# Patient Record
Sex: Female | Born: 1990 | Hispanic: Yes | Marital: Married | Smoking: Never smoker
Health system: Southern US, Community
[De-identification: ages and names within clinical notes are randomized; demographics above are authoritative.]

## PROBLEM LIST (undated history)

## (undated) DIAGNOSIS — D649 Anemia, unspecified: Secondary | ICD-10-CM

---
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
## 2010-11-03 ENCOUNTER — Ambulatory Visit: Payer: Self-pay | Admitting: Family Medicine

## 2010-12-05 ENCOUNTER — Ambulatory Visit: Payer: Self-pay | Admitting: Family Medicine

## 2011-05-01 ENCOUNTER — Inpatient Hospital Stay: Payer: Self-pay | Admitting: Obstetrics and Gynecology

## 2011-05-01 LAB — CBC WITH DIFFERENTIAL/PLATELET
Basophil #: 0 10*3/uL (ref 0.0–0.1)
Basophil %: 0 %
Eosinophil #: 0.1 10*3/uL (ref 0.0–0.7)
HCT: 26.9 % — ABNORMAL LOW (ref 35.0–47.0)
Lymphocyte #: 1.4 10*3/uL (ref 1.0–3.6)
Lymphocyte %: 16.9 %
MCHC: 32.3 g/dL (ref 32.0–36.0)
MCV: 71 fL — ABNORMAL LOW (ref 80–100)
Monocyte #: 0.7 10*3/uL (ref 0.0–0.7)
Monocyte %: 7.9 %
Neutrophil #: 6.3 10*3/uL (ref 1.4–6.5)
Platelet: 373 10*3/uL (ref 150–440)
RBC: 3.77 10*6/uL — ABNORMAL LOW (ref 3.80–5.20)
WBC: 8.5 10*3/uL (ref 3.6–11.0)

## 2011-05-03 LAB — HEMOGLOBIN: HGB: 8.1 g/dL — ABNORMAL LOW (ref 12.0–16.0)

## 2011-05-07 ENCOUNTER — Ambulatory Visit: Payer: Self-pay | Admitting: Neonatology

## 2013-06-01 ENCOUNTER — Encounter (HOSPITAL_COMMUNITY): Payer: Self-pay | Admitting: Emergency Medicine

## 2013-06-01 ENCOUNTER — Emergency Department (HOSPITAL_COMMUNITY): Payer: Self-pay

## 2013-06-01 ENCOUNTER — Emergency Department (HOSPITAL_COMMUNITY)
Admission: EM | Admit: 2013-06-01 | Discharge: 2013-06-01 | Disposition: A | Discharge status: Home / Self Care | Payer: Self-pay | Attending: Emergency Medicine | Admitting: Emergency Medicine

## 2013-06-01 DIAGNOSIS — Z79899 Other long term (current) drug therapy: Secondary | ICD-10-CM | POA: Insufficient documentation

## 2013-06-01 DIAGNOSIS — O200 Threatened abortion: Secondary | ICD-10-CM | POA: Insufficient documentation

## 2013-06-01 DIAGNOSIS — Z3490 Encounter for supervision of normal pregnancy, unspecified, unspecified trimester: Secondary | ICD-10-CM

## 2013-06-01 LAB — URINALYSIS, ROUTINE W REFLEX MICROSCOPIC
BILIRUBIN URINE: NEGATIVE
GLUCOSE, UA: NEGATIVE mg/dL
KETONES UR: NEGATIVE mg/dL
Leukocytes, UA: NEGATIVE
Nitrite: NEGATIVE
SPECIFIC GRAVITY, URINE: 1.025 (ref 1.005–1.030)
Urobilinogen, UA: 0.2 mg/dL (ref 0.0–1.0)
pH: 6 (ref 5.0–8.0)

## 2013-06-01 LAB — PREGNANCY, URINE: PREG TEST UR: POSITIVE — AB

## 2013-06-01 LAB — WET PREP, GENITAL
Clue Cells Wet Prep HPF POC: NONE SEEN
Trich, Wet Prep: NONE SEEN
Yeast Wet Prep HPF POC: NONE SEEN

## 2013-06-01 LAB — URINE MICROSCOPIC-ADD ON

## 2013-06-01 LAB — HCG, QUANTITATIVE, PREGNANCY: hCG, Beta Chain, Quant, S: 110 m[IU]/mL — ABNORMAL HIGH (ref <5)

## 2013-06-01 LAB — ABO/RH: ABO/RH(D): O POS

## 2013-06-01 NOTE — ED Provider Notes (Addendum)
CSN: [id number]     Arrival date & time 06/01/13  1531 History   First MD Initiated Contact with Patient 06/01/13 1550     Chief Complaint  Patient presents with  . Vaginal Bleeding     (Consider location/radiation/quality/duration/timing/severity/associated sxs/prior Treatment) Patient is a 23 y.o. female presenting with vaginal bleeding. The history is provided by the patient and the spouse. A language interpreter was used (Spouse).  Vaginal Bleeding  She is pregnant, with last menstrual period, February 2015, no prenatal care yet. She developed vaginal bleeding, and abdominal pain, today. The bleeding is mild. She denies weakness, dizziness, nausea, or vomiting, cough, shortness of breath, or chest pain. There are no other known modifying factors.  History reviewed. No pertinent past medical history. History reviewed. No pertinent past surgical history. History reviewed. No pertinent family history. History  Substance Use Topics  . Smoking status: Never Smoker   . Smokeless tobacco: Not on file  . Alcohol Use: No   OB History   Grav Para Term Preterm Abortions TAB SAB Ect Mult Living                 Review of Systems  Genitourinary: Positive for vaginal bleeding.  All other systems reviewed and are negative.     Allergies  Review of patient's allergies indicates no known allergies.  Home Medications   Prior to Admission medications   Medication Sig Start Date End Date Taking? Authorizing Provider  Prenatal Vit-Fe Fumarate-FA (MULTIVITAMIN-PRENATAL) 27-0.8 MG TABS tablet Take 1 tablet by mouth daily at 12 noon.   Yes Historical Provider, MD   BP 108/67  Pulse 76  Temp(Src) 98.6 F (37 C) (Oral)  Resp 18  Wt 155 lb (70.308 kg)  SpO2 97%  LMP 04/02/2013 Physical Exam  Nursing note and vitals reviewed. Constitutional: She is oriented to person, place, and time. She appears well-developed and well-nourished.  HENT:  Head: Normocephalic and atraumatic.  Eyes:  Conjunctivae and EOM are normal. Pupils are equal, round, and reactive to light.  Neck: Normal range of motion and phonation normal. Neck supple.  Cardiovascular: Normal rate, regular rhythm and intact distal pulses.   Pulmonary/Chest: Effort normal and breath sounds normal. She exhibits no tenderness.  Abdominal: Soft. She exhibits no distension. There is tenderness (left pelvic, mild). There is no guarding.  Genitourinary:  Normal external female genitalia. Vaginal bleeding present, from the cervical os. Mild mucoid discharge from the cervix. The cervix is closed. On bimanual examination the uterus is mildly enlarged, consistent with dates. There is mild left adnexal tenderness, without left adnexal mass. I was unable to palpate either ovary.  Musculoskeletal: Normal range of motion.  Neurological: She is alert and oriented to person, place, and time. She exhibits normal muscle tone.  Skin: Skin is warm and dry.  Psychiatric: She has a normal mood and affect. Her behavior is normal. Judgment and thought content normal.    ED Course  Procedures (including critical care time)  17:08- ultrasound imaging ordered to evaluate for the possibility of ectopic pregnancy. She is hemodynamically stable, at this time.  9:25 PM-Consult complete with Eure. Patient case explained and discussed. He agrees see patient for further evaluation and treatment. Call ended at 21:30  Medications - No data to display  Patient Vitals for the past 24 hrs:  BP Temp Temp src Pulse Resp SpO2 Weight  06/01/13 2135 108/67 mmHg - - 76 18 97 % -  06/01/13 2027 108/64 mmHg 98.6 F (37 C) Oral  76 16 97 % -  06/01/13 1922 113/66 mmHg 98.8 F (37.1 C) Oral 72 16 98 % -  06/01/13 1747 100/54 mmHg - - 71 16 98 % -  06/01/13 1537 125/66 mmHg 97.5 F (36.4 C) Oral 72 14 99 % 155 lb (70.308 kg)    9:34 PM Reevaluation with update and discussion. After initial assessment and treatment, an updated evaluation reveals No further  c/o. Findings discussed with Husband and PAtient, all questions answered. Flint Melter   Labs Review Labs Reviewed  WET PREP, GENITAL - Abnormal; Notable for the following:    WBC, Wet Prep HPF POC FEW (*)    All other components within normal limits  PREGNANCY, URINE - Abnormal; Notable for the following:    Preg Test, Ur POSITIVE (*)    All other components within normal limits  URINALYSIS, ROUTINE W REFLEX MICROSCOPIC - Abnormal; Notable for the following:    APPearance HAZY (*)    Hgb urine dipstick LARGE (*)    Protein, ur TRACE (*)    All other components within normal limits  URINE MICROSCOPIC-ADD ON - Abnormal; Notable for the following:    Squamous Epithelial / LPF FEW (*)    All other components within normal limits  HCG, QUANTITATIVE, PREGNANCY - Abnormal; Notable for the following:    hCG, Beta Chain, Quant, S 110 (*)    All other components within normal limits  GC/CHLAMYDIA PROBE AMP  RPR  HIV ANTIBODY (ROUTINE TESTING)  ABO/RH    Imaging Review US Ob Comp Less 14 Wks  06/01/2013   CLINICAL DATA:  Pregnant, bleeding, left-sided pelvic pain  EXAM: OBSTETRIC <14 WK [place] AND TRANSVAGINAL OB US  TECHNIQUE: Both transabdominal and transvaginal ultrasound examinations were performed for complete evaluation of the gestation as well as the maternal uterus, adnexal regions, and pelvic cul-de-sac. Transvaginal technique was performed to assess early pregnancy.  COMPARISON:  None.  FINDINGS: Intrauterine gestational sac: Not visualized  Maternal uterus/adnexae: Endometrial complex measures 27 mm.  Bilateral ovaries are within normal limits. No adnexal mass is seen.  Small volume pelvic ascites.  IMPRESSION: No IUP is visualized.  By definition, this reflects a pregnancy of unknown location. Primary differential considerations include early normal IUP, abnormal IUP, or nonvisualized ectopic pregnancy. Correlate with beta HCG.  Serial beta HCG is suggested, supplemented by repeat  pelvic sonography in 14 days (or earlier as clinically warranted).   Electronically Signed   By: Charline Bills M.D.   On: 06/01/2013 19:01   US Ob Transvaginal  06/01/2013   CLINICAL DATA:  Pregnant, bleeding, left-sided pelvic pain  EXAM: OBSTETRIC <14 WK [place] AND TRANSVAGINAL OB US  TECHNIQUE: Both transabdominal and transvaginal ultrasound examinations were performed for complete evaluation of the gestation as well as the maternal uterus, adnexal regions, and pelvic cul-de-sac. Transvaginal technique was performed to assess early pregnancy.  COMPARISON:  None.  FINDINGS: Intrauterine gestational sac: Not visualized  Maternal uterus/adnexae: Endometrial complex measures 27 mm.  Bilateral ovaries are within normal limits. No adnexal mass is seen.  Small volume pelvic ascites.  IMPRESSION: No IUP is visualized.  By definition, this reflects a pregnancy of unknown location. Primary differential considerations include early normal IUP, abnormal IUP, or nonvisualized ectopic pregnancy. Correlate with beta HCG.  Serial beta HCG is suggested, supplemented by repeat pelvic sonography in 14 days (or earlier as clinically warranted).   Electronically Signed   By: Charline Bills M.D.   On: 06/01/2013 19:01  EKG Interpretation None      MDM   Final diagnoses:  Pregnancy  Threatened abortion    Early pregnancy with vaginal bleeding. Rh+. BHCG much less than expected for date of pregnancy. Inevitable abortion expected, possibly already terminated.  Nursing Notes Reviewed/ Care Coordinated Applicable Imaging Reviewed Interpretation of Laboratory Data incorporated into ED treatment  The patient appears reasonably screened and/or stabilized for discharge and I doubt any other medical condition or other [facility] requiring further screening, evaluation, or treatment in the ED at this time prior to discharge.  Plan: Home Medications- none; Home Treatments- rest; return here if the recommended treatment,  does not improve the symptoms; Recommended follow up- GYN 72 hrs for eval. And repeat Quant. HCG and prn  2150- I gave the patient  verbal discharge instructions and extensive explanation for the findings, today, using the language line, translator.   Flint MelterElliott L Elfida Shimada, MD 06/01/13 2138  Flint MelterElliott L Dossie Swor, MD 06/01/13 2152

## 2013-06-01 NOTE — ED Notes (Addendum)
In with EDP to perform pelvic exam. Pt tolerated pelvic exam well. Radiology has called for [place]

## 2013-06-01 NOTE — ED Notes (Signed)
Pt husband reports pt took a home pregnancy test on Wednesday that read positive. Pt husband reports that pt started having small amount of vaginal bleeding this am. Pt husband reports pt has had two pregnancies in the past with no complication. Pt reports LLQ abdominal pain but denies n/v.

## 2013-06-01 NOTE — ED Notes (Signed)
EDP aware pt ready for pelvic exam.

## 2013-06-01 NOTE — Discharge Instructions (Signed)
The pregnancy may be going through termination. It is important to rest, drink a lot of fluids, and avoid intercourse. You will need to followup with the gynecologist for a checkup in 3 days. You can take Tylenol for pain. Return here, if needed, for problems.     Psychiatristmbarazo (Pregnancy) Si planea quedar embarazada, es una buena idea concertar una cita de preconcepcin con el mdico para poder lograr un estilo de vida saludable ante de quedar embarazada. Esto incluye dieta, peso, ejercicio, el tomar vitaminas prenatales en especial cido flico (ayuda a prevenir defectos en el cerebro y la mdula espinal), evitar el alcohol, fumar, las drogas ilegales, problemas mdicos (diabetes, convulsiones), historial familiar de problemas genticos, condiciones de trabajo e inmunizaciones. Es mejor tener conocimiento de estas cosas y Radio producerhacer algo antes de quedar embarazada. Si est embarazada, es necesario que siga ciertas pautas para tener un beb sano. Es muy importante Education officer, environmentalrealizar controles prenatales adecuados y seguir las indicaciones del profesional que la asiste. La atencin prenatal incluye toda la asistencia mdica que usted recibe antes del nacimiento del beb. Esto ayuda a Publishing copyprevenir problemas durante el embarazo y Kamrarel parto. INSTRUCCIONES PARA EL CUIDADO DOMICILIARIO  Comience las consultas prenatales alrededor de la 12 semana de embarazo o lo antes posible. Al principio generalmente se programan cada mes. Se hacen ms frecuentes en los 2 ltimos meses antes del parto. Es importante que concurra a todas las citas con el profesional y siga sus instrucciones con Camera operatorrespecto a los medicamentos que deba Chemical engineerutilizar, a la actividad fsica y a Psychologist, forensicla dieta.  Durante el embarazo debe obtener nutrientes para usted y para su beb. Consuma una dieta normal y bien balanceada. Elija alimentos como carne, pescado, [place]leche y otros productos lcteos, vegetales, frutas, panes integrales y Research officer, trade unioncereales El profesional le informar cul es el  aumento de Roscoepeso ideal, segn su peso y Risk analystaltura actuales. Beba gran cantidad de lquidos. Trate de beber 8 vasos de lquidos por Futures traderda.  El alcohol se asocia a cierto nmero de defectos del nacimiento, incluyendo el sndrome de alcoholismo fetal. Lo mejor es evitarlo completamente El cigarrillo causa nacimientos prematuros y bebs de bajo peso al Psychologist, clinicalnacer. El consumo de alcohol y nicotina durante el embarazo tambin aumentan marcadamente la probabilidad de que el nio sea qumicamente dependiente en etapas posteriores de su vida y puede contribuir al sndrome de muerte sbita infantil (SMSI)  No consuma drogas.  Solo tome medicamentos prescriptos o de venta libre que le haya recomendado el profesional. Algunos medicamentos pueden causar problemas genticos y fsicos al beb  Las nuseas matinales pueden aliviarse si come Chiropodistalgunas galletitas saladas en la cama. Coma dos galletitas antes de levantarse por la maana.  Las relaciones sexuales pueden continuarse hasta casi el final del embarazo, si no se presentan otros problemas como prdida prematura (antes de tiempo) de lquido amnitico, Restaurant manager, fast foodhemorragia vaginal, dolor durante las relaciones sexuales o dolor abdominal (en el vientre).  Practique ejercicios con regularidad. Consulte con el profesional que la asiste si no sabe con certeza si determinados ejercicios son seguros.  No utilice la baera con agua caliente, baos turcos y saunas. Estos aumentan el riesgo de sufrir un desmayo o de prdida del conocimiento, y as Runner, broadcasting/film/videolastimarse usted o el beb. La natacin es un buen ejercicio. Descanse todo lo que pueda e incluya una siesta despus de almorzar siempre que le sea posible, especialmente durante el tercer trimestre.  Evite los olores y las sustancias qumicas txicas.  No use zapatos de tacones altos, podra perder el  equilibrio y Retail bankercaer.  No levante objetos de ms de 2,5 kg. Si levanta un objeto, flexione las piernas y los muslos, y no la espalda.  Evite los  viajes largos, Paramedicespecialmente en el tercer trimestre.  Si debe viajar fuera de la ciudad o de su estado, lleve una copia de la historia clnica. SOLICITE ATENCIN MDICA DE INMEDIATO SI:  La temperatura oral se eleva sin motivo por encima de 38,9 C (102 F) o segn le indique el profesional que lo asiste.  Tiene una prdida de lquido por la vagina. Si sospecha una ruptura de las Milbridgemembranas, tmese la temperatura y llame al profesional para informarlo sobre esto.  Observa unas pequeas manchas o una hemorragia vaginal Notifique al profesional acerca de la cantidad y de cuntos apsitos est utilizando.  Contina teniendo nuseas y no obtiene alivio de los Cardinal Healthmedicamentos que le han Newaldindicado, o vomita sangre o una sustancia similar a la borra del caf.  Presenta un dolor en la zona superior del abdomen.  Siente molestias en el ligamento redondo en la parte abdominal baja. El profesional que la asiste Hydrologistla evaluar.  Siente pequeas contracciones del tero (matriz)  No siente que el beb se mueve, o percibe menos movimientos que antes.  Siente dolor al ConocoPhillipsorinar.  Brett Fairybserva una hemorragia vaginal anormal.  Tiene diarrea persistente.  Sufre una cefalea grave.  Tiene problemas visuales.  Comienza a sentir debilidad muscular.  Se siente mareada o sufre un desmayo.  Comienza a sentir falta de aire.  Siente dolor en el pecho.  Sufre dolor en la espalda que se irradia hacia la pierna y el pie.  Siente latidos cardacos irregulares o la frecuencia cardaca es muy rpida.  Aumenta excesivamente de peso en un perodo breve (2,5 kg en 3 a 5 das)  Se ve envuelta en una situacin de violencia domstica. Document Released: 11/02/2004 Document Revised: 04/17/2011 [facility] Patient Information 2014 [employer], [place].

## 2013-06-02 LAB — HIV ANTIBODY (ROUTINE TESTING W REFLEX): HIV: NONREACTIVE

## 2013-06-02 LAB — RPR

## 2013-06-03 LAB — GC/CHLAMYDIA PROBE AMP
CT Probe RNA: NEGATIVE
GC PROBE AMP APTIMA: NEGATIVE

## 2014-04-17 LAB — OB RESULTS CONSOLE GBS: GBS: NEGATIVE

## 2014-05-31 NOTE — Op Note (Signed)
PATIENT NAME:  Aimee Sherman, Aimee Sherman MR#:  [id number] DATE OF BIRTH:  07-17-1990  DATE OF PROCEDURE:  05/02/2011  ADDENDUM  This is a clarification on Aimee Sherman. I  wanted to make sure that I had the orientation correct--  the left arm was posterior, right arm was anterior, and on immediate assessment in the delivery room the neonatologist felt that most likely the left clavicle was fractured. Too soon to make accurate assessment on neural function of the right upper extremity. Again, the left shoulder was posterior, right was anterior, probable fractured left clavicle.   ____________________________ Reatha Harpsicky L. Logan BoresEvans, MD rle:bjt D: 05/02/2011 03:44:46 ET T: 05/02/2011 11:15:13 ET JOB#: [id number]  cc: Aimee L. Logan BoresEvans, MD, <Dictator> Aimee MoodICK L Elsie Baynes MD ELECTRONICALLY SIGNED 05/03/2011 11:25

## 2014-05-31 NOTE — Op Note (Signed)
PATIENT NAME:  Aimee Sherman, Aimee Sherman MR#:  [id number] DATE OF BIRTH:  [birth year]  DATE OF PROCEDURE:  05/02/2011  PREOPERATIVE DIAGNOSES:  1. Severe shoulder dystocia. 2. [redacted] week gestation.   POSTOPERATIVE DIAGNOSES: 1. Severe shoulder dystocia. 2. [redacted] week gestation.   PROCEDURE: Management of shoulder dystocia with delivery of infant.  OPERATOR: Ricky L. Logan BoresEvans, MD    ESTIMATED BLOOD LOSS: 500.  COMPLICATION: Probable fracture of the left clavicle.   DRAINS: None.   PROCEDURE IN DETAIL: I was called to delivery room from my call room for delivery. Mom was a 89 year old G2 P1 at 41 weeks and 1 day. The patient appeared to be pushing well and there was evidence of a "turtle effect" and asked nurse to be ready for potential dystocia. Bed was flattened, lowered, and foot stool had been brought over previously by the nurse as she  anticipated the same.    Head of infant was delivered with immediate identification of shoulder dystocia, right shoulder anterior.   With nurse providing suprapubic pressure, attempted wood screw maneuver which was unsuccessful. Gentle downward traction was attempted but care was taken not to overextend the neck.   After approximately 2-1/2 minutes of attempted repositioning of the shoulders, I was able to deliver the left arm in an anterior sweeping motion from caudad to cephalad which allowed the anterior shoulder to drop. Rest of the infant was delivered, cord was clamped x2 and cut. Infant was noted to be floppy initially and Neonatology immediately assessed.   Mom handled the procedure well. There was no evidence of lacerations and IM Pitocin along with p.o. Cytotec was given.   Cord gas is pending as are Apgars. Infant was breathing on its own and was taken to Special Care Nursery for observation.   ____________________________ Reatha Harpsicky L. Logan BoresEvans, MD rle:drc D: 05/02/2011 03:42:32 ET T: 05/02/2011 11:07:14 ET JOB#: [id number]  cc: Clide Clifficky L. Logan BoresEvans, MD,  <Dictator> Augustina MoodICK L Shernell Saldierna MD ELECTRONICALLY SIGNED 05/03/2011 11:25

## 2014-10-21 LAB — OB RESULTS CONSOLE ABO/RH: RH Type: POSITIVE

## 2014-10-21 LAB — OB RESULTS CONSOLE HGB/HCT, BLOOD
HCT: 33 %
Hemoglobin: 11.2 g/dL

## 2014-10-21 LAB — OB RESULTS CONSOLE HEPATITIS B SURFACE ANTIGEN: HEP B S AG: NEGATIVE

## 2014-10-21 LAB — OB RESULTS CONSOLE VARICELLA ZOSTER ANTIBODY, IGG: VARICELLA IGG: IMMUNE

## 2014-10-21 LAB — OB RESULTS CONSOLE ANTIBODY SCREEN: ANTIBODY SCREEN: NEGATIVE

## 2014-10-21 LAB — OB RESULTS CONSOLE RPR: RPR: NONREACTIVE

## 2014-10-21 LAB — OB RESULTS CONSOLE RUBELLA ANTIBODY, IGM: RUBELLA: IMMUNE

## 2014-10-21 LAB — OB RESULTS CONSOLE GC/CHLAMYDIA
Chlamydia: NEGATIVE
Gonorrhea: NEGATIVE

## 2014-10-21 LAB — OB RESULTS CONSOLE HIV ANTIBODY (ROUTINE TESTING): HIV: NONREACTIVE

## 2014-11-04 ENCOUNTER — Other Ambulatory Visit: Payer: Self-pay | Admitting: Physician Assistant

## 2014-11-04 LAB — HEMOGLOBIN: Hemoglobin: 10.5 g/dL — ABNORMAL LOW (ref 12.0–15.0)

## 2014-11-27 ENCOUNTER — Other Ambulatory Visit: Payer: Self-pay | Admitting: Family Medicine

## 2014-11-27 DIAGNOSIS — Z331 Pregnant state, incidental: Secondary | ICD-10-CM

## 2014-12-16 ENCOUNTER — Ambulatory Visit
Admission: RE | Admit: 2014-12-16 | Discharge: 2014-12-16 | Disposition: A | Discharge status: Home / Self Care | Payer: Self-pay | Source: Ambulatory Visit | Attending: Family Medicine | Admitting: Family Medicine

## 2014-12-16 ENCOUNTER — Ambulatory Visit: Payer: Self-pay

## 2014-12-16 DIAGNOSIS — Z36 Encounter for antenatal screening of mother: Secondary | ICD-10-CM | POA: Insufficient documentation

## 2014-12-16 DIAGNOSIS — Z3A19 19 weeks gestation of pregnancy: Secondary | ICD-10-CM | POA: Insufficient documentation

## 2014-12-16 DIAGNOSIS — Z331 Pregnant state, incidental: Secondary | ICD-10-CM

## 2014-12-29 ENCOUNTER — Ambulatory Visit: Payer: Self-pay | Admitting: Physician Assistant

## 2015-02-07 NOTE — L&D Delivery Note (Signed)
Delivery Note At 3:25 AM a viable female was delivered via Vaginal, Spontaneous Delivery (Presentation: LOA;  ).spontaneous cry on perineum. Delayed cord clamping  APGAR:8/9 , ; weight  .   Placenta status: nl + meconium staining  , .  Cord:  with the following complications: Thick meconium .    Anesthesia: Epidural  Episiotomy:  none Lacerations: 1st degree Suture Repair: 3.0 vicryl Est. Blood Loss (mL):  400 cc  Mom to postpartum.  Baby to Couplet care / Skin to Skin.  Aimee Sherman 05/07/2015, 3:44 AM

## 2015-04-29 NOTE — H&P (Signed)
Aimee Sherman is a 25 y.o. female [phone] presenting for induction of labor at 41+2wks. However, she came in and delivered spontaneously prior to her induction date, so this H&P is no longer relevant. Please ignore. THe electronic medical record system does not let me delete this planned H&P.   History OB History    No data available     No past medical history on file. No past surgical history on file. Family History: family history is not on file. Social History:  reports that she has never smoked. She does not have any smokeless tobacco history on file. She reports that she does not drink alcohol or use illicit drugs.   ROS    There were no vitals taken for this visit. Exam Physical Exam    Aimee Sherman 04/29/2015, 12:32 PM

## 2015-05-05 ENCOUNTER — Emergency Department (HOSPITAL_COMMUNITY)
Admission: EM | Admit: 2015-05-05 | Discharge: 2015-05-05 | Disposition: A | Discharge status: Home / Self Care | Payer: Self-pay | Attending: Emergency Medicine | Admitting: Emergency Medicine

## 2015-05-05 ENCOUNTER — Encounter (HOSPITAL_COMMUNITY): Payer: Self-pay | Admitting: Emergency Medicine

## 2015-05-05 DIAGNOSIS — R600 Localized edema: Secondary | ICD-10-CM | POA: Insufficient documentation

## 2015-05-05 DIAGNOSIS — O479 False labor, unspecified: Secondary | ICD-10-CM

## 2015-05-05 DIAGNOSIS — Z3A39 39 weeks gestation of pregnancy: Secondary | ICD-10-CM | POA: Insufficient documentation

## 2015-05-05 LAB — URINE MICROSCOPIC-ADD ON
BACTERIA UA: NONE SEEN
WBC UA: NONE SEEN WBC/hpf (ref 0–5)

## 2015-05-05 LAB — CBC WITH DIFFERENTIAL/PLATELET
BASOS ABS: 0 10*3/uL (ref 0.0–0.1)
Basophils Relative: 0 %
EOS ABS: 0.1 10*3/uL (ref 0.0–0.7)
Eosinophils Relative: 1 %
HCT: 25.9 % — ABNORMAL LOW (ref 36.0–46.0)
Hemoglobin: 7.7 g/dL — ABNORMAL LOW (ref 12.0–15.0)
LYMPHS PCT: 19 %
Lymphs Abs: 1.9 10*3/uL (ref 0.7–4.0)
MCH: 20.3 pg — AB (ref 26.0–34.0)
MCHC: 29.7 g/dL — ABNORMAL LOW (ref 30.0–36.0)
MCV: 68.3 fL — ABNORMAL LOW (ref 78.0–100.0)
MONOS PCT: 8 %
Monocytes Absolute: 0.8 10*3/uL (ref 0.1–1.0)
NEUTROS ABS: 7.3 10*3/uL (ref 1.7–7.7)
Neutrophils Relative %: 72 %
PLATELETS: 389 10*3/uL (ref 150–400)
RBC: 3.79 MIL/uL — AB (ref 3.87–5.11)
RDW: 18.6 % — ABNORMAL HIGH (ref 11.5–15.5)
WBC: 10.1 10*3/uL (ref 4.0–10.5)

## 2015-05-05 LAB — COMPREHENSIVE METABOLIC PANEL
ALBUMIN: 2.5 g/dL — AB (ref 3.5–5.0)
ALT: 10 U/L — ABNORMAL LOW (ref 14–54)
ANION GAP: 9 (ref 5–15)
AST: 16 U/L (ref 15–41)
Alkaline Phosphatase: 160 U/L — ABNORMAL HIGH (ref 38–126)
BUN: 6 mg/dL (ref 6–20)
CHLORIDE: 110 mmol/L (ref 101–111)
CO2: 18 mmol/L — AB (ref 22–32)
Calcium: 8.7 mg/dL — ABNORMAL LOW (ref 8.9–10.3)
Creatinine, Ser: 0.45 mg/dL (ref 0.44–1.00)
GFR calc Af Amer: >60 mL/min (ref >60)
GFR calc non Af Amer: >60 mL/min (ref >60)
GLUCOSE: 101 mg/dL — AB (ref 65–99)
Potassium: 3.4 mmol/L — ABNORMAL LOW (ref 3.5–5.1)
SODIUM: 137 mmol/L (ref 135–145)
Total Bilirubin: 0.5 mg/dL (ref 0.3–1.2)
Total Protein: 6.2 g/dL — ABNORMAL LOW (ref 6.5–8.1)

## 2015-05-05 LAB — URINALYSIS, ROUTINE W REFLEX MICROSCOPIC
BILIRUBIN URINE: NEGATIVE
Bilirubin Urine: NEGATIVE
Glucose, UA: NEGATIVE mg/dL
Glucose, UA: NEGATIVE mg/dL
Ketones, ur: NEGATIVE mg/dL
Ketones, ur: NEGATIVE mg/dL
LEUKOCYTES UA: NEGATIVE
NITRITE: NEGATIVE
NITRITE: NEGATIVE
PH: 6 (ref 5.0–8.0)
PH: 6 (ref 5.0–8.0)
Protein, ur: NEGATIVE mg/dL
Protein, ur: NEGATIVE mg/dL
SPECIFIC GRAVITY, URINE: 1.005 (ref 1.005–1.030)
SPECIFIC GRAVITY, URINE: 1.01 (ref 1.005–1.030)

## 2015-05-05 NOTE — Accreditation Note (Signed)
Dr. Fayrene FearingJames notified of the plan of care.

## 2015-05-05 NOTE — ED Notes (Signed)
OB RN at bedside

## 2015-05-05 NOTE — ED Notes (Signed)
Pt here with contractions every 6 minutes since last night at midnight. Is [redacted] weeks pregnant. G3P2. Denies water breaking, or fluid leakage. Pt is a x 4. Husband at bedside.

## 2015-05-05 NOTE — Progress Notes (Signed)
I have reviewed the pt's prenatal record. Her EDC is 05/11/2015 which puts her at 39 1/[redacted] weeks gestation.

## 2015-05-05 NOTE — ED Provider Notes (Signed)
CSN: [id number]     Arrival date & time 05/05/15  [id number] History   First MD Initiated Contact with Patient 05/05/15 0901     Chief Complaint  Patient presents with  . Contractions      HPI  Patient presents for evaluation of possible contractions.  39 weeks. G3 P2. No rupture of membranes. Presents here stating that about 7:00 last start started feeling some contractions. Husband states the closest A been together as about every 7 minutes. Intermittent contractions upon arrival.  Per the husband who translates for the patient, she has had an uncomplicated pregnancy. Does not describe PIH, eclampsia, preterm labor.  History reviewed. No pertinent past medical history. History reviewed. No pertinent past surgical history. History reviewed. No pertinent family history. Social History  Substance Use Topics  . Smoking status: Never Smoker   . Smokeless tobacco: None  . Alcohol Use: No   OB History    Gravida Para Term Preterm AB TAB SAB Ectopic Multiple Living   1              Review of Systems  Constitutional: Negative for fever, chills, diaphoresis, appetite change and fatigue.  HENT: Negative for mouth sores, sore throat and trouble swallowing.   Eyes: Negative for visual disturbance.  Respiratory: Negative for cough, chest tightness, shortness of breath and wheezing.   Cardiovascular: Negative for chest pain.  Gastrointestinal: Negative for nausea, vomiting, abdominal pain, diarrhea and abdominal distention.  Endocrine: Negative for polydipsia, polyphagia and polyuria.  Genitourinary: Negative for dysuria, frequency and hematuria.       Possible uterine contractions.  Musculoskeletal: Negative for gait problem.  Skin: Negative for color change, pallor and rash.  Neurological: Negative for dizziness, syncope, light-headedness and headaches.  Hematological: Does not bruise/bleed easily.  Psychiatric/Behavioral: Negative for behavioral problems and confusion.       Allergies  Review of patient's allergies indicates no known allergies.  Home Medications   Prior to Admission medications   Medication Sig Start Date End Date Taking? Authorizing Provider  Prenatal Vit-Fe Fumarate-FA (MULTIVITAMIN-PRENATAL) 27-0.8 MG TABS tablet Take 1 tablet by mouth daily at 12 noon.    Historical Provider, MD   BP 115/80 mmHg  Pulse 75  Temp(Src) 97.5 F (36.4 C) (Oral)  Resp 18  SpO2 96% Physical Exam  Constitutional: She is oriented to person, place, and time. She appears well-developed and well-nourished. No distress.  HENT:  Head: Normocephalic.  Eyes: Conjunctivae are normal. Pupils are equal, round, and reactive to light. No scleral icterus.  Neck: Normal range of motion. Neck supple. No thyromegaly present.  Cardiovascular: Normal rate and regular rhythm.  Exam reveals no gallop and no friction rub.   No murmur heard. Pulmonary/Chest: Effort normal and breath sounds normal. No respiratory distress. She has no wheezes. She has no rales.  Abdominal: Soft. Bowel sounds are normal. She exhibits no distension. There is no tenderness. There is no rebound.  Gravid abdomen. With her right hand indicates that her symptoms are low midline abdomen. Has no tenderness of the abdomen and between contractions.  Musculoskeletal: Normal range of motion.  Neurological: She is alert and oriented to person, place, and time.  Skin: Skin is warm and dry. No rash noted.  1+ symmetric lower chin edema. One beat clonus. Normal DTRs to the Achilles.  Psychiatric: She has a normal mood and affect. Her behavior is normal.    ED Course  Procedures (including critical care time) Labs Review Labs Reviewed  CBC WITH  DIFFERENTIAL/PLATELET - Abnormal; Notable for the following:    RBC 3.79 (*)    Hemoglobin 7.7 (*)    HCT 25.9 (*)    MCV 68.3 (*)    MCH 20.3 (*)    MCHC 29.7 (*)    RDW 18.6 (*)    All other components within normal limits  COMPREHENSIVE METABOLIC  PANEL - Abnormal; Notable for the following:    Potassium 3.4 (*)    CO2 18 (*)    Glucose, Bld 101 (*)    Calcium 8.7 (*)    Total Protein 6.2 (*)    Albumin 2.5 (*)    ALT 10 (*)    Alkaline Phosphatase 160 (*)    All other components within normal limits  URINALYSIS, ROUTINE W REFLEX MICROSCOPIC (NOT AT [facility]) - Abnormal; Notable for the following:    APPearance CLOUDY (*)    Hgb urine dipstick MODERATE (*)    Leukocytes, UA LARGE (*)    All other components within normal limits  URINE MICROSCOPIC-ADD ON - Abnormal; Notable for the following:    Squamous Epithelial / LPF 6-30 (*)    Bacteria, UA RARE (*)    All other components within normal limits  URINALYSIS, ROUTINE W REFLEX MICROSCOPIC (NOT AT [facility]) - Abnormal; Notable for the following:    Hgb urine dipstick MODERATE (*)    All other components within normal limits  URINE MICROSCOPIC-ADD ON - Abnormal; Notable for the following:    Squamous Epithelial / LPF 0-5 (*)    All other components within normal limits    Imaging Review No results found. I have personally reviewed and evaluated these images and lab results as part of my medical decision-making.   EKG Interpretation None      MDM   Final diagnoses:  Irregular uterine contractions    Not hypertensive. Minimal edema. HELLP labs obtained. Per liter rapid response patient has had few, intermittent, not organized contractions. Reassuring strip. Cervix is closed, thick, and posterior. Will continue monitoring for any additional activity. Rapid response RN will discuss with OB attending regarding disposition.   Urine shows white blood cells and contamination. Cath urine does not show infection. Culture requested and pending. Clearly not in labor per OB monitoring. Discharged home. Encouraged to [facility] upon ruptured membranes or active labor.   Rolland Porter, MD 05/05/15 1323

## 2015-05-05 NOTE — Progress Notes (Signed)
Spoke with Dr. Erin FullingHarraway-Smith. Pt is 39 1/7 weeks gestattion, G3P2 , all vaginal deliveries. She is here today because of lower abd pain. TThere are some uc's on the monitor, FHR is a category1 tracing, Pt's cervix is closed, thk, presenting part high,-3 station. No vagial bleeding or leaking of fluid. Pt has a HGB od 7.7 and a HCT of 25.9. Pt can be OB cleared and is to follow up with her OB in [place].

## 2015-05-05 NOTE — Progress Notes (Signed)
Pt is a G3P2 at 394/[redacted] weeks gestation with an EDC of 05/08/2015. The previous deliveries were vaginal. Husband is translating for the pt. They speak spanish. He says that she has had no problems with this pregnancy and that she has no significant medical hx. There is no vaginal bleeding or leaking of fluid. Husband says she has been having abdominal pain since last  Night around midnight. [place] and she [facility].Her next appointment is on Monday April 3,2017 with Tonia GhentAbby Bender MD. Pt is planning on delivering her baby at [facility].

## 2015-05-05 NOTE — Discharge Instructions (Signed)
When you develop signs of labor, or your water breaks, [facility].

## 2015-05-06 ENCOUNTER — Inpatient Hospital Stay
Admission: EM | Admit: 2015-05-06 | Discharge: 2015-05-08 | DRG: 775 | Disposition: A | Discharge status: Home / Self Care | Payer: Medicaid Other | Attending: Obstetrics and Gynecology | Admitting: Obstetrics and Gynecology

## 2015-05-06 DIAGNOSIS — Z3490 Encounter for supervision of normal pregnancy, unspecified, unspecified trimester: Secondary | ICD-10-CM

## 2015-05-06 DIAGNOSIS — O9902 Anemia complicating childbirth: Principal | ICD-10-CM | POA: Diagnosis present

## 2015-05-06 DIAGNOSIS — D509 Iron deficiency anemia, unspecified: Secondary | ICD-10-CM | POA: Diagnosis present

## 2015-05-06 DIAGNOSIS — Z3A39 39 weeks gestation of pregnancy: Secondary | ICD-10-CM

## 2015-05-07 ENCOUNTER — Inpatient Hospital Stay: Payer: Medicaid Other | Admitting: Anesthesiology

## 2015-05-07 DIAGNOSIS — Z3A39 39 weeks gestation of pregnancy: Secondary | ICD-10-CM | POA: Diagnosis not present

## 2015-05-07 DIAGNOSIS — O9902 Anemia complicating childbirth: Secondary | ICD-10-CM | POA: Diagnosis present

## 2015-05-07 DIAGNOSIS — D509 Iron deficiency anemia, unspecified: Secondary | ICD-10-CM | POA: Diagnosis present

## 2015-05-07 DIAGNOSIS — O99013 Anemia complicating pregnancy, third trimester: Secondary | ICD-10-CM | POA: Diagnosis present

## 2015-05-07 DIAGNOSIS — Z3490 Encounter for supervision of normal pregnancy, unspecified, unspecified trimester: Secondary | ICD-10-CM

## 2015-05-07 LAB — CBC
HCT: 25.2 % — ABNORMAL LOW (ref 35.0–47.0)
Hemoglobin: 7.8 g/dL — ABNORMAL LOW (ref 12.0–16.0)
MCH: 20.4 pg — ABNORMAL LOW (ref 26.0–34.0)
MCHC: 30.8 g/dL — ABNORMAL LOW (ref 32.0–36.0)
MCV: 66.3 fL — ABNORMAL LOW (ref 80.0–100.0)
PLATELETS: 388 10*3/uL (ref 150–440)
RBC: 3.8 MIL/uL (ref 3.80–5.20)
RDW: 19.7 % — AB (ref 11.5–14.5)
WBC: 10.4 10*3/uL (ref 3.6–11.0)

## 2015-05-07 LAB — TYPE AND SCREEN
ABO/RH(D): O POS
ANTIBODY SCREEN: NEGATIVE

## 2015-05-07 LAB — ABO/RH: ABO/RH(D): O POS

## 2015-05-07 MED ORDER — DIPHENHYDRAMINE HCL 50 MG/ML IJ SOLN: 12.5000 mg | INTRAMUSCULAR | Status: DC | PRN

## 2015-05-07 MED ORDER — LACTATED RINGERS IV SOLN: 500.0000 mL | Freq: Once | INTRAVENOUS | Status: DC

## 2015-05-07 MED ORDER — MISOPROSTOL 200 MCG PO TABS
ORAL_TABLET | ORAL | Status: AC
  Filled 2015-05-07: qty 4

## 2015-05-07 MED ORDER — DIPHENHYDRAMINE HCL 25 MG PO CAPS: 25.0000 mg | ORAL_CAPSULE | Freq: Four times a day (QID) | ORAL | Status: DC | PRN

## 2015-05-07 MED ORDER — BENZOCAINE-MENTHOL 20-0.5 % EX AERO: 1.0000 "application " | INHALATION_SPRAY | CUTANEOUS | Status: DC | PRN

## 2015-05-07 MED ORDER — LIDOCAINE HCL (PF) 1 % IJ SOLN
INTRAMUSCULAR | Status: AC
  Filled 2015-05-07: qty 30

## 2015-05-07 MED ORDER — ACETAMINOPHEN 325 MG PO TABS
650.0000 mg | ORAL_TABLET | ORAL | Status: DC | PRN
  Administered 2015-05-07: 650 mg via ORAL
  Filled 2015-05-07: qty 2

## 2015-05-07 MED ORDER — FENTANYL 2.5 MCG/ML W/ROPIVACAINE 0.2% IN NS 100 ML EPIDURAL INFUSION (ARMC-ANES): 9.0000 mL/h | EPIDURAL | Status: DC

## 2015-05-07 MED ORDER — EPHEDRINE 5 MG/ML INJ
10.0000 mg | INTRAVENOUS | Status: DC | PRN
  Filled 2015-05-07: qty 2

## 2015-05-07 MED ORDER — WITCH HAZEL-GLYCERIN EX PADS: 1.0000 "application " | MEDICATED_PAD | CUTANEOUS | Status: DC | PRN

## 2015-05-07 MED ORDER — MEASLES, MUMPS & RUBELLA VAC ~~LOC~~ INJ
0.5000 mL | INJECTION | Freq: Once | SUBCUTANEOUS | Status: DC
  Filled 2015-05-07: qty 0.5

## 2015-05-07 MED ORDER — AMMONIA AROMATIC IN INHA
RESPIRATORY_TRACT | Status: AC
  Filled 2015-05-07: qty 10

## 2015-05-07 MED ORDER — ONDANSETRON HCL 4 MG/2ML IJ SOLN: 4.0000 mg | INTRAMUSCULAR | Status: DC | PRN

## 2015-05-07 MED ORDER — FENTANYL 2.5 MCG/ML W/ROPIVACAINE 0.2% IN NS 100 ML EPIDURAL INFUSION (ARMC-ANES)
EPIDURAL | Status: AC
  Administered 2015-05-07: 9 mL/h via EPIDURAL
  Filled 2015-05-07: qty 100

## 2015-05-07 MED ORDER — SIMETHICONE 80 MG PO CHEW: 80.0000 mg | CHEWABLE_TABLET | ORAL | Status: DC | PRN

## 2015-05-07 MED ORDER — IBUPROFEN 600 MG PO TABS
600.0000 mg | ORAL_TABLET | Freq: Four times a day (QID) | ORAL | Status: DC
  Administered 2015-05-07 – 2015-05-08 (×4): 600 mg via ORAL
  Filled 2015-05-07 (×4): qty 1

## 2015-05-07 MED ORDER — LANOLIN HYDROUS EX OINT: TOPICAL_OINTMENT | CUTANEOUS | Status: DC | PRN

## 2015-05-07 MED ORDER — PHENYLEPHRINE 40 MCG/ML (10ML) SYRINGE FOR IV PUSH (FOR BLOOD PRESSURE SUPPORT)
80.0000 ug | PREFILLED_SYRINGE | INTRAVENOUS | Status: DC | PRN
  Filled 2015-05-07: qty 2

## 2015-05-07 MED ORDER — BUPIVACAINE HCL (PF) 0.25 % IJ SOLN
INTRAMUSCULAR | Status: DC | PRN
  Administered 2015-05-07: 5 mL via EPIDURAL

## 2015-05-07 MED ORDER — ONDANSETRON HCL 4 MG PO TABS: 4.0000 mg | ORAL_TABLET | ORAL | Status: DC | PRN

## 2015-05-07 MED ORDER — LACTATED RINGERS IV SOLN: 500.0000 mL | INTRAVENOUS | Status: DC | PRN

## 2015-05-07 MED ORDER — FERROUS SULFATE 325 (65 FE) MG PO TABS
325.0000 mg | ORAL_TABLET | Freq: Two times a day (BID) | ORAL | Status: DC
  Administered 2015-05-07 – 2015-05-08 (×4): 325 mg via ORAL
  Filled 2015-05-07 (×4): qty 1

## 2015-05-07 MED ORDER — OXYTOCIN BOLUS FROM INFUSION: 500.0000 mL | INTRAVENOUS | Status: DC

## 2015-05-07 MED ORDER — BUTORPHANOL TARTRATE 1 MG/ML IJ SOLN
1.0000 mg | INTRAMUSCULAR | Status: DC | PRN
Start: 2015-05-07 — End: 2015-05-07

## 2015-05-07 MED ORDER — LACTATED RINGERS IV SOLN: INTRAVENOUS | Status: DC

## 2015-05-07 MED ORDER — OXYTOCIN 40 UNITS IN LACTATED RINGERS INFUSION - SIMPLE MED
2.5000 [IU]/h | INTRAVENOUS | Status: DC
  Filled 2015-05-07 (×2): qty 1000

## 2015-05-07 MED ORDER — MAGNESIUM HYDROXIDE 400 MG/5ML PO SUSP: 30.0000 mL | ORAL | Status: DC | PRN

## 2015-05-07 MED ORDER — OXYTOCIN 10 UNIT/ML IJ SOLN
INTRAMUSCULAR | Status: AC
  Filled 2015-05-07: qty 2

## 2015-05-07 MED ORDER — PRENATAL MULTIVITAMIN CH
1.0000 | ORAL_TABLET | Freq: Every day | ORAL | Status: DC
  Administered 2015-05-07 – 2015-05-08 (×2): 1 via ORAL
  Filled 2015-05-07 (×2): qty 1

## 2015-05-07 MED ORDER — DIBUCAINE 1 % RE OINT: 1.0000 "application " | TOPICAL_OINTMENT | RECTAL | Status: DC | PRN

## 2015-05-07 MED ORDER — LIDOCAINE-EPINEPHRINE (PF) 1.5 %-1:200000 IJ SOLN
INTRAMUSCULAR | Status: DC | PRN
  Administered 2015-05-07: 2 mL via EPIDURAL

## 2015-05-07 MED ORDER — SENNOSIDES-DOCUSATE SODIUM 8.6-50 MG PO TABS
2.0000 | ORAL_TABLET | ORAL | Status: DC
  Administered 2015-05-08: 2 via ORAL
  Filled 2015-05-07: qty 2

## 2015-05-07 MED ORDER — ZOLPIDEM TARTRATE 5 MG PO TABS: 5.0000 mg | ORAL_TABLET | Freq: Every evening | ORAL | Status: DC | PRN

## 2015-05-07 NOTE — H&P (Signed)
Aimee Sherman is a 25 y.o. female presenting for 39+6 week with regular ctx . No LOF . Negative GBS History OB History    Gravida Para Term Preterm AB TAB SAB Ectopic Multiple Living   3 2 2  0 0 0 0 0 0 2     No past medical history on file.H/o anemia , H.pylori gastritis  No past surgical history on file. Family History: family history is not on file. Social History:  reports that she has never smoked. She does not have any smokeless tobacco history on file. She reports that she does not drink alcohol or use illicit drugs.   Prenatal Transfer Tool  Maternal Diabetes: No Genetic Screening: Declined Maternal Ultrasounds/Referrals: Normal Fetal Ultrasounds or other Referrals:  None Maternal Substance Abuse:  No Significant Maternal Medications:  None Significant Maternal Lab Results:  Lab values include: Group B Strep negative3/29/17 hct 25.9 Other Comments:  None  ROS  Dilation: 3.5 Effacement (%): 90 Station: -2 Exam by:: gaccione Blood pressure 114/66, pulse 96, temperature 98.5 F (36.9 C), temperature source Oral, resp. rate 20, last menstrual period 08/01/2014, unknown if currently breastfeeding.   My cervix exam : 6 cm / c/ 0 VTX AROM : thick meconium AT 0115 Exam Physical Exam  Lungs CTA  CV RRR  Abd gravid  NST : reassuring  Prenatal labs: ABO, Rh: O/Positive/-- (09/14 0000) Antibody: Negative (09/14 0000) Rubella: Immune (09/14 0000) RPR: Nonreactive (09/14 0000)  HBsAg: Negative (09/14 0000)  HIV: Non-reactive (09/14 0000)  GBS:   negative   Assessment/Plan: Labor  Thick meconium  Anemia , pt has been T+S Discussed finding with pt + husband via translator Pt requests CLE    Aimee Sherman 05/07/2015, 12:54 AM

## 2015-05-07 NOTE — Anesthesia Procedure Notes (Signed)
Epidural Patient location during procedure: OB Start time: 05/07/2015 1:32 AM End time: 05/07/2015 2:00 AM  Staffing Anesthesiologist: Elijio MilesVAN STAVEREN, Loyd Salvador F Performed by: anesthesiologist   Preanesthetic Checklist Completed: patient identified, site marked, surgical consent, pre-op evaluation, timeout performed, IV checked, risks and benefits discussed and monitors and equipment checked  Epidural Patient position: sitting Prep: Betadine Patient monitoring: heart rate and blood pressure Approach: midline Location: L3-L4 Injection technique: LOR air and LOR saline  Needle:  Needle type: Tuohy  Needle gauge: 18 G Needle length: 9 cm Needle insertion depth: 6 cm Catheter type: closed end flexible Catheter size: 20 Guage Catheter at skin depth: 10 cm Test dose: negative and 1.5% lidocaine with Epi 1:200 K  Assessment Sensory level: T8

## 2015-05-07 NOTE — Discharge Summary (Signed)
Obstetric Discharge Summary Reason for Admission: onset of labor Prenatal Procedures: none Intrapartum Procedures: spontaneous vaginal delivery Postpartum Procedures: none Complications-Operative and Postpartum: thick meconium  HEMOGLOBIN  Date Value Ref Range Status  05/07/2015 7.8* 12.0 - 16.0 g/dL Final  40/98/119109/14/2016 47.811.2 g/dL Final   HGB  Date Value Ref Range Status  05/03/2011 8.1* 12.0-16.0 g/dL Final   HCT  Date Value Ref Range Status  05/07/2015 25.2* 35.0 - 47.0 % Final  10/21/2014 33 % Final  05/01/2011 26.9* 35.0-47.0 % Final    Physical Exam:  General: A,A& O Lochia: appropriate Uterine Fundus: firm Incision: healing well DVT Evaluation: No evidence of DVT seen on physical exam.  Discharge Diagnoses: Term Pregnancy-delivered Fe def Anemia H7H 6.1/19 from 8/26 with 11/33 at start of pregnancy Discharge Information: Date: 05/07/2015 Activity: unrestricted Diet: routine Medications: Ibuprofen Condition: stable Instructions:  Discharge to: home   Newborn Data: Live born unspecified sex  Birth Weight:  8#1oz APGAR: 8-9  Home with mother.  SCHERMERHORN,THOMAS 05/07/2015, 3:49 AM

## 2015-05-07 NOTE — Anesthesia Preprocedure Evaluation (Signed)
Anesthesia Evaluation  Patient identified by MRN, date of birth, ID band Patient awake    Reviewed: Allergy & Precautions, NPO status , Patient's Chart, lab work & pertinent test results  Airway Mallampati: II       Dental no notable dental hx.    Pulmonary neg pulmonary ROS,    Pulmonary exam normal        Cardiovascular negative cardio ROS   Rhythm:Regular     Neuro/Psych negative neurological ROS     GI/Hepatic negative GI ROS, Neg liver ROS,   Endo/Other  negative endocrine ROS  Renal/GU negative Renal ROS  negative genitourinary   Musculoskeletal   Abdominal   Peds negative pediatric ROS (+)  Hematology negative hematology ROS (+)   Anesthesia Other Findings   Reproductive/Obstetrics                             Anesthesia Physical Anesthesia Plan  ASA: II  Anesthesia Plan: Epidural   Post-op Pain Management:    Induction:   Airway Management Planned: Nasal Cannula  Additional Equipment:   Intra-op Plan:   Post-operative Plan:   Informed Consent: I have reviewed the patients History and Physical, chart, labs and discussed the procedure including the risks, benefits and alternatives for the proposed anesthesia with the patient or authorized representative who has indicated his/her understanding and acceptance.     Plan Discussed with: CRNA  Anesthesia Plan Comments:         Anesthesia Quick Evaluation

## 2015-05-08 LAB — CBC
HCT: 19.9 % — ABNORMAL LOW (ref 35.0–47.0)
Hemoglobin: 6.1 g/dL — ABNORMAL LOW (ref 12.0–16.0)
MCH: 20 pg — ABNORMAL LOW (ref 26.0–34.0)
MCHC: 30.6 g/dL — ABNORMAL LOW (ref 32.0–36.0)
MCV: 65.3 fL — ABNORMAL LOW (ref 80.0–100.0)
PLATELETS: 330 10*3/uL (ref 150–440)
RBC: 3.04 MIL/uL — ABNORMAL LOW (ref 3.80–5.20)
RDW: 19.2 % — AB (ref 11.5–14.5)
WBC: 11 10*3/uL (ref 3.6–11.0)

## 2015-05-08 LAB — RPR: RPR: NONREACTIVE

## 2015-05-08 MED ORDER — IBUPROFEN 600 MG PO TABS
600.0000 mg | ORAL_TABLET | Freq: Four times a day (QID) | ORAL | Status: DC
  Administered 2015-05-08 (×2): 600 mg via ORAL
  Filled 2015-05-08 (×2): qty 1

## 2015-05-08 MED ORDER — FERROUS SULFATE 325 (65 FE) MG PO TABS: 325.0000 mg | ORAL_TABLET | Freq: Two times a day (BID) | ORAL | Status: DC

## 2015-05-08 NOTE — Progress Notes (Signed)
Discharge instructions provided.  Pt and sig other verbalize understanding of all instructions and follow-up care via interpreter.  Pt discharged to home with infant at 1920 on 05/08/15 via wheelchair by RN. Reynold BowenSusan Paisley Clarissa Laird, RN 05/08/2015 8:25 PM

## 2015-05-08 NOTE — Anesthesia Postprocedure Evaluation (Signed)
Anesthesia Post Note  Patient: Aimee Sherman  Procedure(s) Performed: * No procedures listed *  Patient location during evaluation: Mother Baby Anesthesia Type: Epidural Level of consciousness: awake and alert Pain management: pain level controlled Vital Signs Assessment: post-procedure vital signs reviewed and stable Respiratory status: spontaneous breathing, nonlabored ventilation and respiratory function stable Cardiovascular status: stable Postop Assessment: no headache, no backache and epidural receding Anesthetic complications: no    Last Vitals:  Filed Vitals:   05/08/15 0111 05/08/15 0810  BP: 98/42 99/55  Pulse: 74 82  Temp: 36.9 C 36.6 C  Resp: 20 20    Last Pain:  Filed Vitals:   05/08/15 0811  PainSc: 2                  Yevette EdwardsJames G Azarian Starace

## 2015-05-08 NOTE — Progress Notes (Addendum)
Post Partum Day 1 Subjective: no complaints, up ad lib, voiding and tolerating PO  Objective: Blood pressure 99/55, pulse 82, temperature 98.5 F (36.9 C), temperature source Oral, resp. rate 20, last menstrual period 08/01/2014, SpO2 100 %, unknown if currently breastfeeding.  Physical Exam:  General: alert, cooperative and appears stated age Lochia: appropriate Uterine Fundus: firm Perineum: no hematoma. Bleeding minimal DVT Evaluation: No evidence of DVT seen on physical exam.   Recent Labs  05/07/15 0015 05/08/15 0540  HGB 7.8* 6.1*  HCT 25.2* 19.9*  WBC 10.4 11.0  PLT 388 330    Assessment/Plan: DC today per pt request. Baby is okay to be discharged and kids cannot visit due to flu precautions.  Discussed with Dr Dalbert GarnetBeasley whether pt should get blood but, we will offer it to the patient and if she feels she needs it, we can transfuse her for 1-2 upc. Waiting interpreter to discuss with pt.   Interpreter arrived and blood was discussed and risks of refusal including fainting, palpitations tachycardia, SOB and fatigue were discussed and pt declined blood. The blood consent was reviewed and pt declined blood admin. Pt will take FE BID and rest. Advised to let others carry the baby till stronger.    LOS: 1 day   Sharee PimpleCaron W Layken Doenges 05/08/2015, 2:52 PM

## 2015-05-08 NOTE — Discharge Instructions (Signed)
Call your doctor for increased pain or vaginal bleeding, temperature above 100.4, depression, or concerns.  No strenuous activity or heavy lifting for 6 weeks.  No intercourse, tampons, douching, or enemas for 6 weeks.  No tub baths-showers only.  No driving for 2 weeks or while taking pain medications.  Continue prenatal vitamin and iron.  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 La hemoglobina es la sustancia de los glbulos rojos que transporta el oxgeno. La anemia por deficiencia de hierro es la anemia provocada por un nivel bajo de hierro. Este es el tipo ms comn de anemia y puede provocarle cansancio y dificultad para Industrial/product designer. CAUSAS  Falta de hierro en la dieta. Absorcin deficiente de hierro, como sucede con los trastornos intestinales. Hemorragia intestinal. Perodos abundantes. SIGNOS Y SNTOMAS  Cuando la anemia es leve puede pasar inadvertida. Los sntomas pueden ser: [place]. Dolor de [place]. Piel plida. Debilidad. Cansancio. Falta de aire. Mareos. Manos y pies fros. Frecuencia cardaca acelerada o irregular. DIAGNSTICO  El diagnstico requiere una evaluacin y un examen fsico minuciosos por parte del mdico. [place], se realizan anlisis de sangre para confirmar la anemia por deficiencia de hierro. Es posible que se realicen anlisis adicionales para encontrar la causa subyacente de la anemia. Estos pueden ser: Anlisis para Engineer, manufacturing la presencia de [employer] heces (anlisis de sangre oculta en heces). Un procedimiento para examinar el interior del colon y el recto (colonoscopia). Un procedimiento para examinar el interior del esfago y Investment banker, corporate (endoscopia). TRATAMIENTO  La anemia por deficiencia de hierro se trata al remediar la causa de la deficiencia. El  tratamiento incluye: Agregarle a la dieta alimentos ricos en hierro. Tomar suplementos de hierro. Las mujeres embarazadas o que estn amamantando necesitan una dosis adicional de hierro, ya que su dieta normal generalmente no proporciona la cantidad necesaria. Tomar vitaminas. La vitamina C mejora la absorcin del hierro. Es posible que el mdico le recomiende tomar los comprimidos de hierro con un vaso de jugo de naranja o con un suplemento de vitamina C. Medicamentos para disminuir el flujo menstrual abundante. [place]. INSTRUCCIONES PARA EL CUIDADO EN EL HOGAR  Tome el hierro segn las indicaciones del mdico. Si no puede tragar los suplementos de hierro, hable con su mdico sobre la posibilidad de que se lo coloquen en la vena (va intravenosa) o mediante una inyeccin en el msculo. Para lograr una mejor absorcin del hierro, los suplementos se deben tomar con el estmago vaco. Si no los tolera con el estmago vaco, posiblemente deba tomarlos con la comida. No beba leche ni tome anticidos al [employer] suplementos de hierro, ya que estos pueden interferir en la absorcin del hierro. Los suplementos de hierro pueden Investment banker, corporate. Para prevenirlo, asegrese de incluir fibras en su dieta. Posiblemente tambin se recomiende el uso de un ablandador de heces. Tome las vitaminas segn las indicaciones del mdico. Siga una dieta rica en hierro. Los alimentos con alto contenido de hierro incluyen hgado, carne magra, pan de grano integral, [place], frutas deshidratadas y vegetales de hojas color verde oscuro. SOLICITE ATENCIN MDICA DE INMEDIATO SI:  Se desmaya. Si esto sucede, no conduzca. Comunquese con el servicio de emergencias de su localidad (911 en los [place]) si no dispone de [place]. Siente dolor en el pecho. Se siente nauseoso  o vomita. Presenta una dificultad respiratoria grave o en aumento al [employer]. Se siente dbil. Tiene una frecuencia  cardaca acelerada. Comienza a sudar sin motivo. Se siente mareado cuando se levanta de una silla o de la cama. ASEGRESE DE QUE:  Comprende estas instrucciones. Controlar su afeccin. Recibir ayuda de inmediato si no mejora o si empeora.   Esta informacin no tiene Theme park manager el consejo del mdico. Asegrese de hacerle al mdico cualquier pregunta que tenga.   Document Released: 01/23/2005 Document Revised: 02/13/2014 Elsevier Interactive Patient Education 2016 [employer]. Parto vaginal, Cuidados posteriores  (Vaginal Delivery, Care After) Siga estas instrucciones durante las prximas semanas. Estas indicaciones para el alta le proporcionan informacin general acerca de cmo deber cuidarse despus del parto. El mdico tambin podr darle instrucciones especficas. El tratamiento ha sido planificado segn las prcticas mdicas actuales, pero en algunos casos pueden ocurrir problemas. Comunquese con el mdico si tiene algn problema o tiene preguntas al volver a su casa.  INSTRUCCIONES PARA EL CUIDADO EN EL HOGAR   Tome slo medicamentos de venta libre o recetados, segn las indicaciones del mdico o del Social research officer, government.  No beba alcohol, especialmente si est amamantando o toma analgsicos.  No mastique tabaco ni fume.  No consuma drogas.  Contine con un adecuado cuidado perineal. El buen cuidado perineal incluye:  Higienizarse de adelante hacia atrs.  Mantener la zona perineal limpia.  No use tampones ni duchas vaginales hasta que su mdico la autorice.  Dchese, lvese el cabello y tome baos de inmersin segn las indicaciones de su mdico.  Utilice un sostn que le ajuste bien y que brinde buen soporte a sus Building control surveyor.  Consuma alimentos saludables.  Beba suficiente lquido para Photographer orina clara o de color amarillo plido.  Consuma alimentos ricos en fibra como cereales y panes integrales, arroz, frijoles y frutas y verduras frescas todos los [place]. Estos  alimentos pueden ayudarla a prevenir o Educational psychologist.  Siga las recomendaciones de su mdico relacionadas con la reanudacin de actividades como subir escaleras, conducir automviles, levantar objetos, hacer ejercicios o viajar.  Hable con su mdico acerca de reanudar la actividad sexual. Volver a la actividad sexual depende del riesgo de infeccin, la velocidad de la curacin y la comodidad y su deseo de Chartered loss adjuster.  Trate de que alguien la ayude con las actividades del hogar y con el recin nacido al menos durante un par de das despus de salir del hospital.  Descanse todo lo que pueda. Trate de descansar o tomar una siesta mientras el beb est durmiendo.  Aumente sus actividades gradualmente.  Cumpla con todas las visitas de control programadas para despus del parto. Es muy importante asistir a todas las Merchant navy officer de seguimiento. En estas citas, su mdico va a controlarla para asegurarse de que est sanando fsica y emocionalmente. SOLICITE ATENCIN MDICA SI:   Elimina cogulos grandes por la vagina. Guarde algunos cogulos para mostrarle al mdico.  Tiene una secrecin con feo olor que proviene de la vagina.  Tiene dificultad para orinar.  Orina con frecuencia.  Siente dolor al [employer].  Nota un cambio en sus movimientos intestinales.  Aumenta el enrojecimiento, el dolor o la hinchazn en la zona de la incisin vaginal (episiotoma) o el desgarro vaginal.  Tiene pus que drena por la episiotoma o el desgarro vaginal.  La episiotoma o el desgarro vaginal se abren.  Sus [employer] duelen, estn duras o enrojecidas.  Sufre un dolor intenso de [place].  Tiene  visin borrosa o ve manchas.  Se siente triste o deprimida.  Tiene pensamientos acerca de lastimarse o daar al recin nacido.  Tiene preguntas acerca de su cuidado personal, el cuidado del recin nacido o acerca de los medicamentos.  Se siente mareada o sufre un desmayo.  Tiene una  erupcin.  Tiene nuseas o vmitos.  Usted amamant al beb y no ha tenido su perodo menstrual dentro de las 12 semanas despus de dejar de Museum/gallery exhibitions officeramamantar.  No amamanta al beb y no tuvo su perodo menstrual en las ltimas 12 semanas despus del parto.  Tiene fiebre. SOLICITE ATENCIN MDICA DE INMEDIATO SI:   Siente dolor persistente.  Siente dolor en el pecho.  Le falta el aire.  Se desmaya.  Siente dolor en la pierna.  Siente Physiological scientistdolor en el estmago.  El sangrado vaginal satura dos o ms apsitos en 1 hora.   Esta informacin no tiene Theme park managercomo fin reemplazar el consejo del mdico. Asegrese de hacerle al mdico cualquier pregunta que tenga.   Document Released: 01/23/2005 Document Revised: 10/14/2014 Elsevier Interactive Patient Education [employer]2016 Elsevier Inc.

## 2015-05-16 ENCOUNTER — Encounter: Payer: Self-pay | Admitting: Obstetrics and Gynecology

## 2017-01-08 IMAGING — US US OB COMP +14 WK
1 series · 13 of 28 positions shown · non-contrast
Comparison: none

CLINICAL DATA: Fetal anatomic scan

EXAM:
Obstetrical Ultrasound >14 wks

[Series 1: us ob comp +14 wk · 0.22mm/px · 13 of 83 slices shown]
[im 4/83]
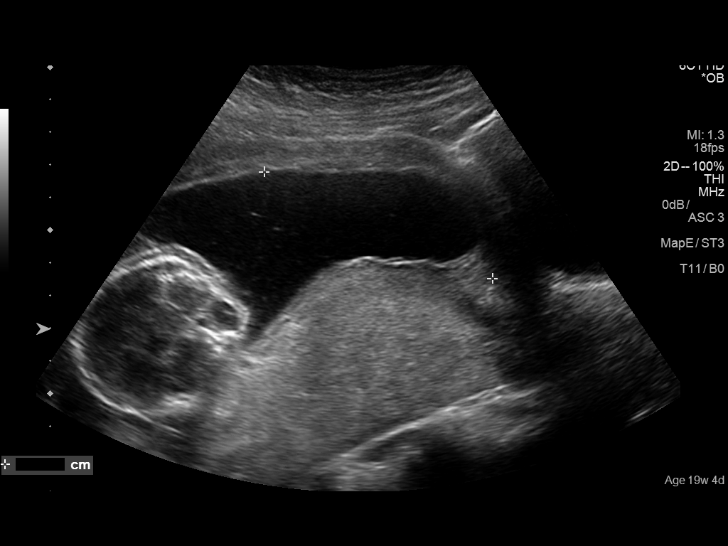
[im 10/83]
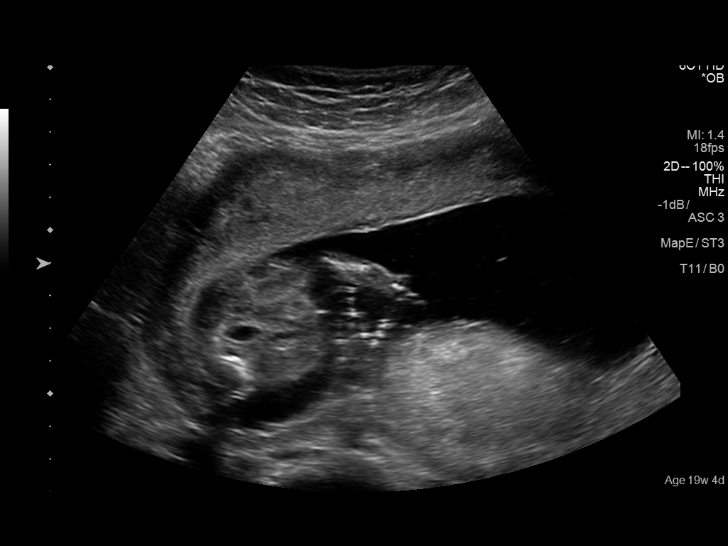
[im 16/83]
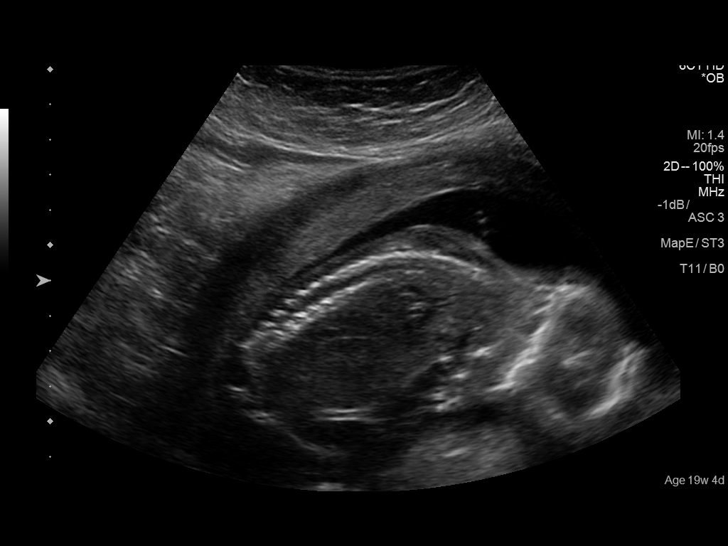
[im 22/83]
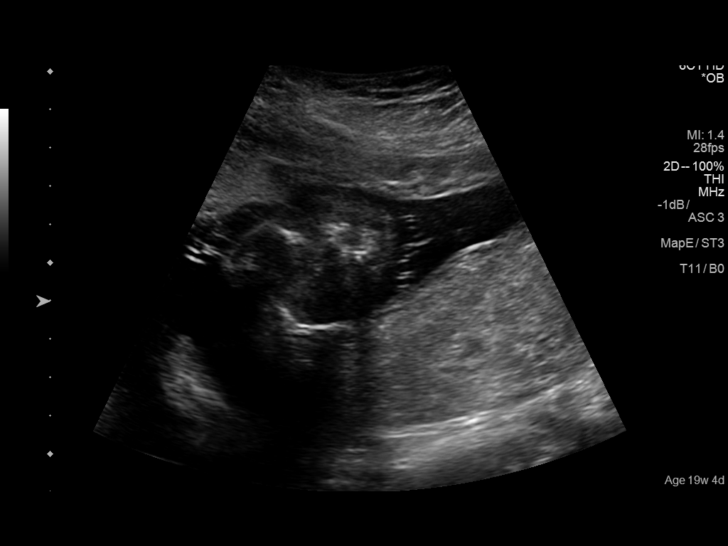
[im 28/83]
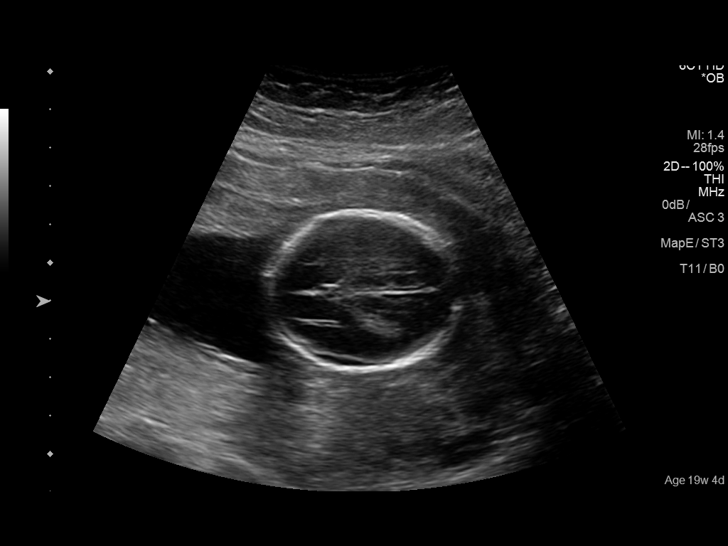
[im 34/83]
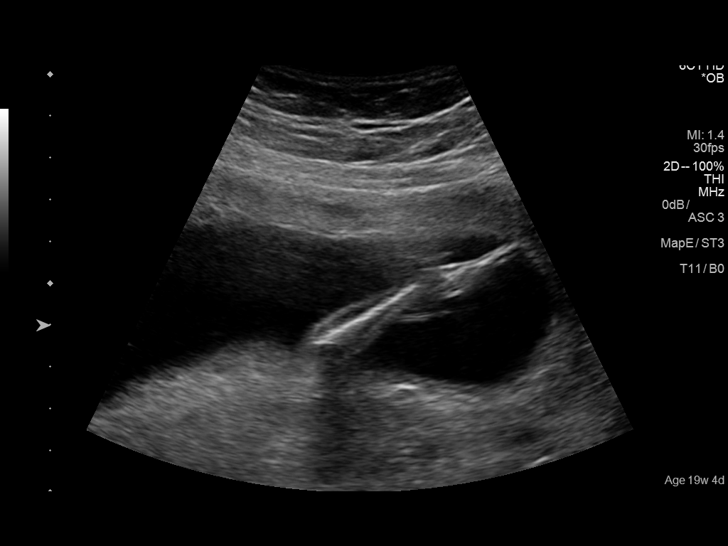
[im 43/83]
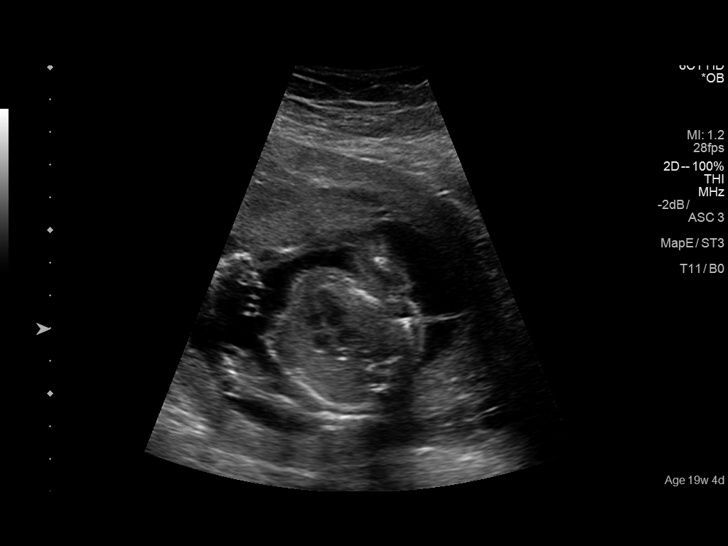
[im 49/83]
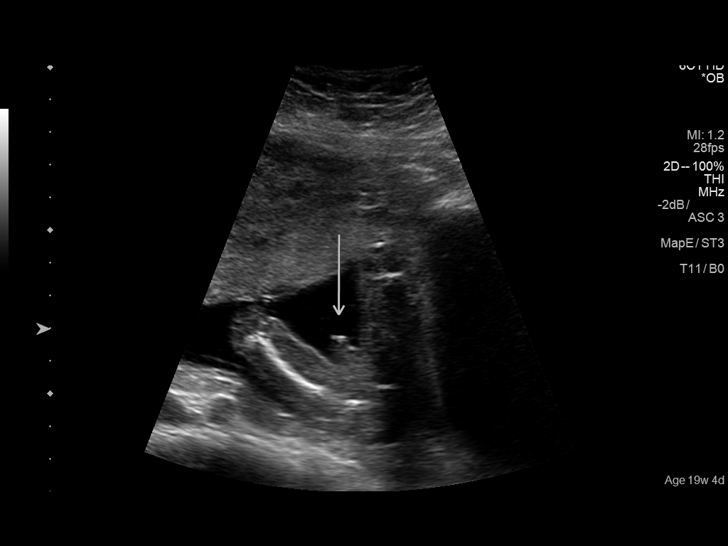
[im 55/83]
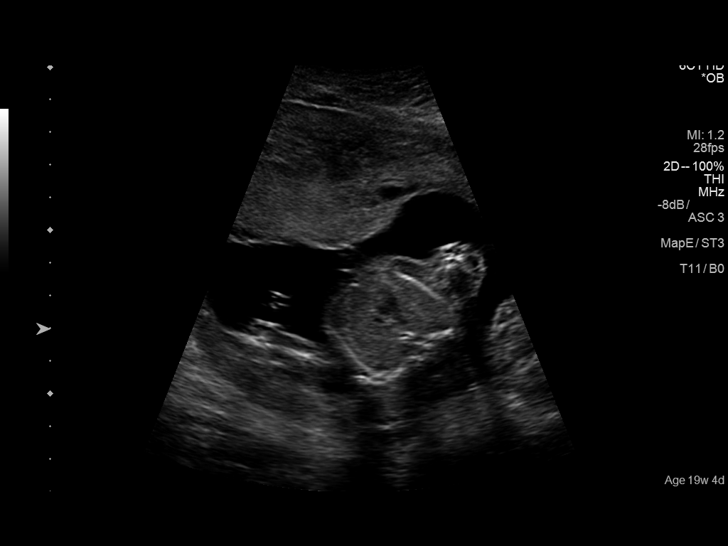
[im 61/83]
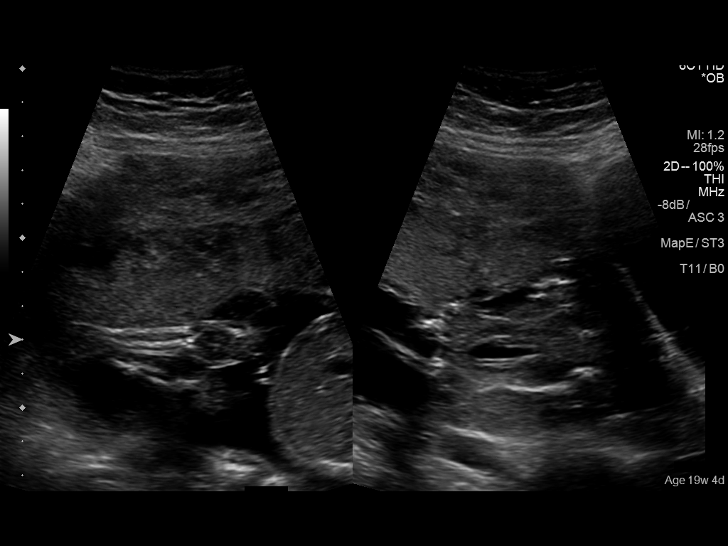
[im 67/83]
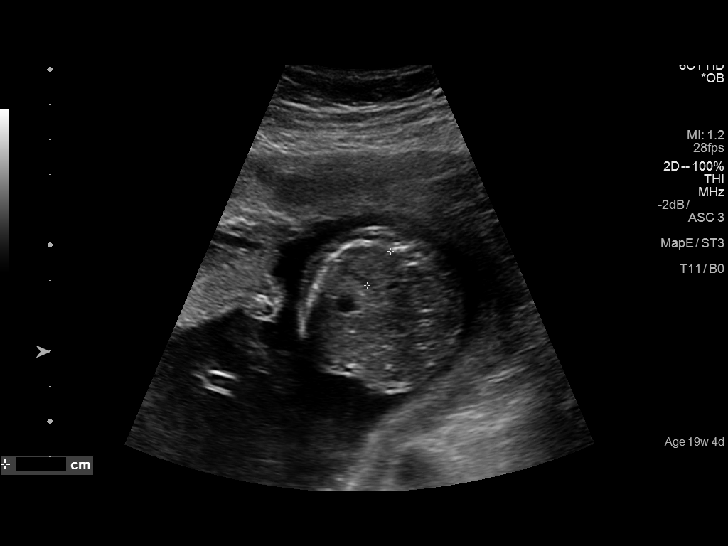
[im 73/83]
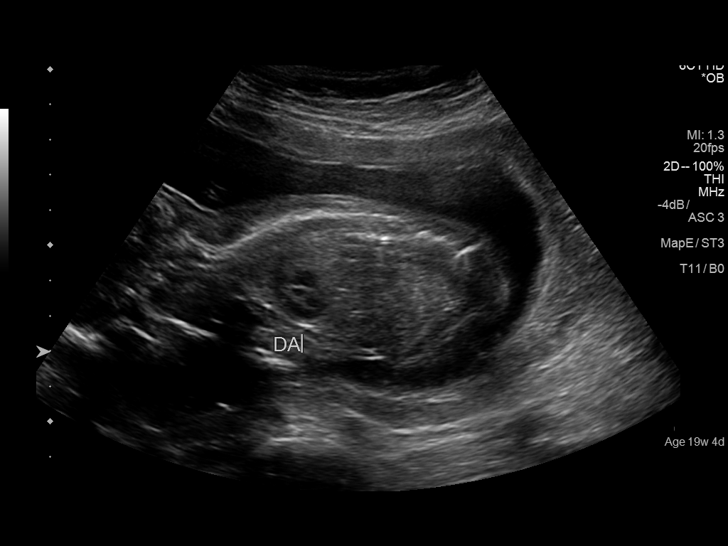
[im 79/83]
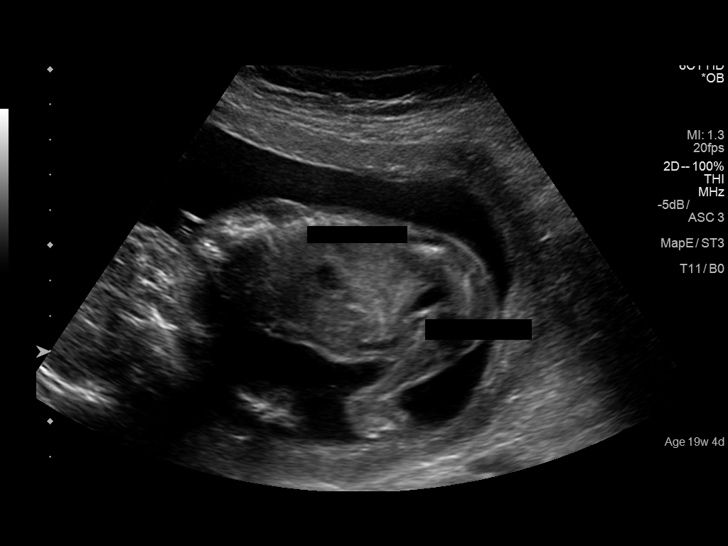

[13 of 28 positions shown; findings below may reference images not displayed]

FINDINGS: Number of Fetuses: 1

Heart Rate:  150 bpm

Movement: Present

Presentation: Breech -variable

Previa: None

Placental Location: Anterior and partially fundal. The distance from
the lower placental segment to the internal cervical os is 7.7 cm.

Amniotic Fluid (Subjective): No normal

Vertical pocket 7cm

FETAL BIOMETRY

BPD:  4.3cm 19w 1d

HC:    15.9cm  18w   5d

AC:   14.19cm  19w   4d

FL:   2.98cm  19w   1d

Current Mean GA: 19w 1d              US EDC: May 11, 2015

FETAL ANATOMY

Lateral Ventricles: Visualized

Thalami/CSP: Visualized

Posterior Fossa:  Visualized

Nuchal Region: Visualized

Upper Lip: Visualized

Spine: Visualized

4 Chamber Heart on Left: Visualized

LVOT: Visualized

RVOT: Visualized

Stomach on Left: Visualized

3 Vessel Cord: Visualized

Cord Insertion site: Visualize

Kidneys: Visualize

Bladder: Visualize

Extremities: Visualized

Sex: Male genitalia

Nose and lips not visualized due to fetal lie.

Maternal Findings:

Cervix:  4.5 cm and appears normal
IMPRESSION: There is a single viable IUP with estimated gestational age of 19
weeks 1 day with estimated date of confinement May 11, 2015. No
fetal anomalies are observed. The presentation is breech -variable.
The placenta is anterior and partially fundal. The amniotic fluid
volume is estimated to be normal.

## 2020-08-12 ENCOUNTER — Encounter (HOSPITAL_COMMUNITY): Payer: Self-pay

## 2020-08-12 ENCOUNTER — Emergency Department (HOSPITAL_COMMUNITY)
Admission: EM | Admit: 2020-08-12 | Discharge: 2020-08-12 | Disposition: A | Discharge status: Home / Self Care | Payer: Self-pay | Attending: Emergency Medicine | Admitting: Emergency Medicine

## 2020-08-12 ENCOUNTER — Other Ambulatory Visit: Payer: Self-pay

## 2020-08-12 DIAGNOSIS — R591 Generalized enlarged lymph nodes: Secondary | ICD-10-CM | POA: Insufficient documentation

## 2020-08-12 HISTORY — DX: Anemia, unspecified: D64.9

## 2020-08-12 LAB — COMPREHENSIVE METABOLIC PANEL
ALT: 15 U/L (ref 0–44)
AST: 19 U/L (ref 15–41)
Albumin: 3.8 g/dL (ref 3.5–5.0)
Alkaline Phosphatase: 62 U/L (ref 38–126)
Anion gap: 6 (ref 5–15)
BUN: 12 mg/dL (ref 6–20)
CO2: 24 mmol/L (ref 22–32)
Calcium: 8.7 mg/dL — ABNORMAL LOW (ref 8.9–10.3)
Chloride: 107 mmol/L (ref 98–111)
Creatinine, Ser: 0.49 mg/dL (ref 0.44–1.00)
GFR, Estimated: >60 mL/min (ref >60)
Glucose, Bld: 106 mg/dL — ABNORMAL HIGH (ref 70–99)
Potassium: 4 mmol/L (ref 3.5–5.1)
Sodium: 137 mmol/L (ref 135–145)
Total Bilirubin: 0.6 mg/dL (ref 0.3–1.2)
Total Protein: 6.8 g/dL (ref 6.5–8.1)

## 2020-08-12 LAB — CBC WITH DIFFERENTIAL/PLATELET
Abs Immature Granulocytes: 0.01 10*3/uL (ref 0.00–0.07)
Basophils Absolute: 0 10*3/uL (ref 0.0–0.1)
Basophils Relative: 0 %
Eosinophils Absolute: 0.1 10*3/uL (ref 0.0–0.5)
Eosinophils Relative: 2 %
HCT: 34.1 % — ABNORMAL LOW (ref 36.0–46.0)
Hemoglobin: 10.4 g/dL — ABNORMAL LOW (ref 12.0–15.0)
Immature Granulocytes: 0 %
Lymphocytes Relative: 31 %
Lymphs Abs: 1.6 10*3/uL (ref 0.7–4.0)
MCH: 23.5 pg — ABNORMAL LOW (ref 26.0–34.0)
MCHC: 30.5 g/dL (ref 30.0–36.0)
MCV: 77 fL — ABNORMAL LOW (ref 80.0–100.0)
Monocytes Absolute: 0.6 10*3/uL (ref 0.1–1.0)
Monocytes Relative: 11 %
Neutro Abs: 2.9 10*3/uL (ref 1.7–7.7)
Neutrophils Relative %: 56 %
Platelets: 217 10*3/uL (ref 150–400)
RBC: 4.43 MIL/uL (ref 3.87–5.11)
RDW: 16.9 % — ABNORMAL HIGH (ref 11.5–15.5)
WBC: 5.3 10*3/uL (ref 4.0–10.5)
nRBC: 0 % (ref 0.0–0.2)

## 2020-08-12 NOTE — ED Triage Notes (Signed)
Pt arrived in POV with c/o nodule to left side of neck. The nodule has been there x4 weeks and painful feeling like a sting. Interpreter used for triage.

## 2020-08-12 NOTE — ED Provider Notes (Signed)
[facility] EMERGENCY DEPARTMENT Provider Note  CSN: [id number] Arrival date & time: 08/12/20 0654    History Chief Complaint  Patient presents with   Abscess    Aimee Sherman is a 30 y.o. female with no significant PMH, does not have a PCP, reports she has had a 'knot' on her L neck for about a month, gradually getting larger. She reports the knot is tender. Denies any fever, cough, URI, N/V/D. She has not noticed any knots anywhere else on her body.    Past Medical History:  Diagnosis Date   Anemia     History reviewed. No pertinent surgical history.  No family history on file.  Social History   Tobacco Use   Smoking status: Never    Passive exposure: Never   Smokeless tobacco: Never  Substance Use Topics   Alcohol use: Yes    Alcohol/week: 2.0 standard drinks    Types: 2 Cans of beer per week    Comment: occassional   Drug use: No     Home Medications Prior to Admission medications   Medication Sig Start Date End Date Taking? Authorizing Provider  ferrous sulfate 325 (65 FE) MG tablet Take 1 tablet (325 mg total) by mouth 2 (two) times daily with a meal. 05/08/15   Sharee Pimple, CNM  Prenatal Vit-Fe Fumarate-FA (MULTIVITAMIN-PRENATAL) 27-0.8 MG TABS tablet Take 1 tablet by mouth daily at 12 noon.    [provider]     Allergies    Patient has no known allergies.   Review of Systems   Review of Systems A comprehensive review of systems was completed and negative except as noted in HPI.    Physical Exam BP 108/71 (BP Location: Left Arm)   Pulse (!) 58   Temp 98.1 F (36.7 C) (Oral)   Resp 15   Ht 4' 8.69" (1.44 m)   Wt 62.6 kg   SpO2 99%   BMI 30.19 kg/m   Physical Exam Vitals and nursing note reviewed.  Constitutional:      Appearance: Normal appearance.  HENT:     Head: Normocephalic and atraumatic.     Nose: Nose normal.     Mouth/Throat:     Mouth: Mucous membranes are moist.  Eyes:     Extraocular Movements:  Extraocular movements intact.     Conjunctiva/sclera: Conjunctivae normal.  Neck:     Comments: There is a single prominent 1cm lymph node in L posterior chain, mildly tender Cardiovascular:     Rate and Rhythm: Normal rate.  Pulmonary:     Effort: Pulmonary effort is normal.     Breath sounds: Normal breath sounds.  Abdominal:     General: Abdomen is flat.     Palpations: Abdomen is soft.     Tenderness: There is no abdominal tenderness.  Musculoskeletal:        General: No swelling. Normal range of motion.     Cervical back: Neck supple.  Skin:    General: Skin is warm and dry.     Findings: No rash.     Comments: No other palpable lymph nodes in cervical, supraclavicular, axillary or inguinal areas  Neurological:     General: No focal deficit present.     Mental Status: She is alert.  Psychiatric:        Mood and Affect: Mood normal.     ED Results / Procedures / Treatments   Labs (all labs ordered are listed, but only abnormal results  are displayed) Labs Reviewed  COMPREHENSIVE METABOLIC PANEL - Abnormal; Notable for the following components:      Result Value   Glucose, Bld 106 (*)    Calcium 8.7 (*)    All other components within normal limits  CBC WITH DIFFERENTIAL/PLATELET - Abnormal; Notable for the following components:   Hemoglobin 10.4 (*)    HCT 34.1 (*)    MCV 77.0 (*)    MCH 23.5 (*)    RDW 16.9 (*)    All other components within normal limits    EKG None  Radiology No results found.  Procedures Procedures  Medications Ordered in the ED Medications - No data to display   MDM Rules/Calculators/A&P MDM Patient with a prominent tender lymph node. She does not have a PCP, will check basic labs.   ED Course  I have reviewed the triage vital signs and the nursing notes.  Pertinent labs & imaging results that were available during my care of the patient were reviewed by me and considered in my medical decision making (see chart for  details).  Clinical Course as of 08/12/20 0943  Thu Aug 12, 2020  0905 CMP is normal.  [CS]  [phone] CBC with mild anemia, no recent for comparison, but significantly improved from values 5 years ago.  [CS]  [id number] Patient nontoxic with a single prominent lymph node but unlikely to be pathologic. Recommend PCP follow up if not improving.  [CS]    Clinical Course User Index [CS] Pollyann Savoy, MD    Final Clinical Impression(s) / ED Diagnoses Final diagnoses:  Lymphadenopathy    Rx / DC Orders ED Discharge Orders     None        Pollyann Savoy, MD 08/12/20 [phone]

## 2020-08-24 ENCOUNTER — Other Ambulatory Visit (HOSPITAL_COMMUNITY): Payer: Self-pay | Admitting: *Deleted

## 2020-08-24 DIAGNOSIS — R590 Localized enlarged lymph nodes: Secondary | ICD-10-CM

## 2020-08-31 ENCOUNTER — Ambulatory Visit (HOSPITAL_COMMUNITY)
Admission: RE | Admit: 2020-08-31 | Discharge: 2020-08-31 | Disposition: A | Discharge status: Home / Self Care | Payer: Self-pay | Source: Ambulatory Visit | Attending: *Deleted | Admitting: *Deleted

## 2020-08-31 ENCOUNTER — Other Ambulatory Visit: Payer: Self-pay

## 2020-08-31 DIAGNOSIS — R590 Localized enlarged lymph nodes: Secondary | ICD-10-CM

## 2021-08-23 ENCOUNTER — Other Ambulatory Visit: Payer: Self-pay | Admitting: Obstetrics & Gynecology

## 2021-08-23 DIAGNOSIS — O3680X0 Pregnancy with inconclusive fetal viability, not applicable or unspecified: Secondary | ICD-10-CM

## 2021-08-24 ENCOUNTER — Ambulatory Visit (INDEPENDENT_AMBULATORY_CARE_PROVIDER_SITE_OTHER): Payer: Self-pay

## 2021-08-24 DIAGNOSIS — O3680X0 Pregnancy with inconclusive fetal viability, not applicable or unspecified: Secondary | ICD-10-CM

## 2021-08-24 DIAGNOSIS — Z3A12 12 weeks gestation of pregnancy: Secondary | ICD-10-CM

## 2021-08-24 NOTE — Progress Notes (Signed)
[place] 12+6 wks,single IUP,CRL 65.55 mm,normal ovaries,FHR 155 bpm,anterior placenta

## 2021-08-31 ENCOUNTER — Encounter: Payer: Self-pay | Admitting: Advanced Practice Midwife

## 2021-08-31 ENCOUNTER — Ambulatory Visit: Payer: Self-pay | Admitting: *Deleted

## 2021-08-31 DIAGNOSIS — Z3490 Encounter for supervision of normal pregnancy, unspecified, unspecified trimester: Secondary | ICD-10-CM | POA: Insufficient documentation

## 2021-09-15 ENCOUNTER — Ambulatory Visit (INDEPENDENT_AMBULATORY_CARE_PROVIDER_SITE_OTHER): Payer: Self-pay | Admitting: Advanced Practice Midwife

## 2021-09-15 ENCOUNTER — Encounter: Payer: Self-pay | Admitting: Advanced Practice Midwife

## 2021-09-15 ENCOUNTER — Ambulatory Visit (INDEPENDENT_AMBULATORY_CARE_PROVIDER_SITE_OTHER): Payer: Self-pay | Admitting: *Deleted

## 2021-09-15 ENCOUNTER — Telehealth: Payer: Self-pay | Admitting: Advanced Practice Midwife

## 2021-09-15 VITALS — BP 100/68 | HR 80 | Wt 143.0 lb

## 2021-09-15 DIAGNOSIS — Z363 Encounter for antenatal screening for malformations: Secondary | ICD-10-CM

## 2021-09-15 DIAGNOSIS — Z3A16 16 weeks gestation of pregnancy: Secondary | ICD-10-CM | POA: Diagnosis not present

## 2021-09-15 DIAGNOSIS — O09299 Supervision of pregnancy with other poor reproductive or obstetric history, unspecified trimester: Secondary | ICD-10-CM | POA: Insufficient documentation

## 2021-09-15 DIAGNOSIS — Z3480 Encounter for supervision of other normal pregnancy, unspecified trimester: Secondary | ICD-10-CM

## 2021-09-15 DIAGNOSIS — Z131 Encounter for screening for diabetes mellitus: Secondary | ICD-10-CM | POA: Diagnosis not present

## 2021-09-15 MED ORDER — BLOOD PRESSURE MONITOR MISC: 0 refills | Status: DC

## 2021-09-15 NOTE — Progress Notes (Signed)
INITIAL OBSTETRICAL VISIT Patient name: Aimee Sherman MRN [id number]  Date of birth: 23-Nov-1990 Chief Complaint:   Initial Prenatal Visit  History of Present Illness:   Aimee Sherman is a 31 y.o. G61P2003  female at [redacted]w[redacted]d by [place] at 49 weeks with an Estimated Date of Delivery: 03/02/22 being seen today for her initial obstetrical visit.   Her obstetrical history is significant for term SVD x 3, 2nd baby was 10# 11oz (forceps, SD w/broken clavical--denies DM).   Today she reports no complaints.     09/15/2021    2:04 PM  Depression screen PHQ 2/9  Decreased Interest 1  Down, Depressed, Hopeless 0  PHQ - 2 Score 1  Altered sleeping 0  Tired, decreased energy 1  Change in appetite 0  Feeling bad or failure about yourself  0  Trouble concentrating 0  Moving slowly or fidgety/restless 0  Suicidal thoughts 0  PHQ-9 Score 2    No LMP recorded. Patient is pregnant. Last pap 2 years ago. Results were: normal Review of Systems:   Pertinent items are noted in HPI Denies cramping/contractions, leakage of fluid, vaginal bleeding, abnormal vaginal discharge w/ itching/odor/irritation, headaches, visual changes, shortness of breath, chest pain, abdominal pain, severe nausea/vomiting, or problems with urination or bowel movements unless otherwise stated above.  Pertinent History Reviewed:  Reviewed past medical,surgical, social, obstetrical and family history.  Reviewed problem list, medications and allergies. OB History  Gravida Para Term Preterm AB Living  4 3 2  0 0 3  SAB IAB Ectopic Multiple Live Births  0 0 0 0 3    # Outcome Date GA Lbr Len/2nd Weight Sex Delivery Anes PTL Lv  4 Current           3 Para 05/07/15  / 00:25 8 lb 1.5 oz (3.67 kg) M Vag-Spont EPI N LIV  2 Term 05/02/11 [redacted]w[redacted]d  10 lb 11 oz (4.848 kg) F Vag-Spont EPI N LIV  1 Term 09/02/07 [redacted]w[redacted]d  7 lb 8 oz (3.402 kg) F Vag-Spont None N LIV   Physical Assessment:   Vitals:   09/15/21 1341  BP:  100/68  Pulse: 80  Weight: 143 lb (64.9 kg)  Body mass index is 31.28 kg/m.       Physical Examination:  General appearance - well appearing, and in no distress  Mental status - alert, oriented to person, place, and time  Psych:  She has a normal mood and affect  Skin - warm and dry, normal color, no suspicious lesions noted  Chest - effort normal  Heart - normal rate and regular rhythm  Abdomen - soft, nontender  Extremities:  No swelling or varicosities noted   No results found for this or any previous visit (from the past 24 hour(s)).  Assessment & Plan:  1) Low-Risk Pregnancy G3P2003 at [redacted]w[redacted]d with an Estimated Date of Delivery: 03/02/22   2) Initial OB visit  3) Hx macrosomia, screen for DM  Meds:  Meds ordered this encounter  Medications   Blood Pressure Monitor MISC    Sig: For regular home bp monitoring during pregnancy    Dispense:  1 each    Refill:  0    Z34.81 Please mail to patient    Initial labs obtained Continue prenatal vitamins Reviewed n/v relief measures and warning s/s to report Reviewed recommended weight gain based on pre-gravid BMI Encouraged well-balanced diet Genetic & carrier screening discussed: declines Panorama, NT/IT, AFP, and Horizon , Ultrasound discussed; fetal survey:  requested CCNC completed> form faxed if has or is planning to apply for medicaid The nature of [facility] [employer] with multiple MDs and other Advanced Practice Providers was explained to patient; also emphasized that fellows, residents, and students are part of our team. Doesn't have a home bp cuff, out of stock now, will give next visit.        Aimee Sherman 3:02 PM

## 2021-09-15 NOTE — Patient Instructions (Signed)

## 2021-09-16 ENCOUNTER — Encounter: Payer: Self-pay | Admitting: Advanced Practice Midwife

## 2021-09-16 DIAGNOSIS — D649 Anemia, unspecified: Secondary | ICD-10-CM | POA: Insufficient documentation

## 2021-09-16 LAB — CBC/D/PLT+RPR+RH+ABO+RUBIGG...
Antibody Screen: NEGATIVE
Basophils Absolute: 0 10*3/uL (ref 0.0–0.2)
Basos: 0 %
EOS (ABSOLUTE): 0.1 10*3/uL (ref 0.0–0.4)
Eos: 1 %
HCV Ab: NONREACTIVE
HIV Screen 4th Generation wRfx: NONREACTIVE
Hematocrit: 25.2 % — ABNORMAL LOW (ref 34.0–46.6)
Hemoglobin: 7.9 g/dL — ABNORMAL LOW (ref 11.1–15.9)
Hepatitis B Surface Ag: NEGATIVE
Immature Grans (Abs): 0.1 10*3/uL (ref 0.0–0.1)
Immature Granulocytes: 1 %
Lymphocytes Absolute: 2.8 10*3/uL (ref 0.7–3.1)
Lymphs: 26 %
MCH: 23 pg — ABNORMAL LOW (ref 26.6–33.0)
MCHC: 31.3 g/dL — ABNORMAL LOW (ref 31.5–35.7)
MCV: 74 fL — ABNORMAL LOW (ref 79–97)
Monocytes Absolute: 0.9 10*3/uL (ref 0.1–0.9)
Monocytes: 8 %
Neutrophils Absolute: 6.6 10*3/uL (ref 1.4–7.0)
Neutrophils: 64 %
Platelets: 390 10*3/uL (ref 150–450)
RBC: 3.43 x10E6/uL — ABNORMAL LOW (ref 3.77–5.28)
RDW: 15.2 % (ref 11.7–15.4)
RPR Ser Ql: NONREACTIVE
Rh Factor: POSITIVE
Rubella Antibodies, IGG: 1.72 index (ref >0.99)
WBC: 10.5 10*3/uL (ref 3.4–10.8)

## 2021-09-16 LAB — HCV INTERPRETATION

## 2021-09-17 LAB — URINE CULTURE

## 2021-09-17 LAB — GC/CHLAMYDIA PROBE AMP
Chlamydia trachomatis, NAA: NEGATIVE
Neisseria Gonorrhoeae by PCR: NEGATIVE

## 2021-09-22 LAB — SPECIMEN STATUS REPORT

## 2021-09-26 LAB — HGB A1C W/O EAG: Hgb A1c MFr Bld: 5.7 % — ABNORMAL HIGH (ref 4.8–5.6)

## 2021-09-26 LAB — SPECIMEN STATUS REPORT

## 2021-10-14 ENCOUNTER — Encounter: Payer: Self-pay | Admitting: Obstetrics & Gynecology

## 2021-10-14 ENCOUNTER — Ambulatory Visit (INDEPENDENT_AMBULATORY_CARE_PROVIDER_SITE_OTHER): Payer: BC Managed Care – PPO

## 2021-10-14 ENCOUNTER — Ambulatory Visit (INDEPENDENT_AMBULATORY_CARE_PROVIDER_SITE_OTHER): Payer: BC Managed Care – PPO | Admitting: Obstetrics & Gynecology

## 2021-10-14 VITALS — BP 92/62 | HR 81 | Wt 143.6 lb

## 2021-10-14 DIAGNOSIS — O09299 Supervision of pregnancy with other poor reproductive or obstetric history, unspecified trimester: Secondary | ICD-10-CM

## 2021-10-14 DIAGNOSIS — Z3482 Encounter for supervision of other normal pregnancy, second trimester: Secondary | ICD-10-CM

## 2021-10-14 DIAGNOSIS — Z363 Encounter for antenatal screening for malformations: Secondary | ICD-10-CM

## 2021-10-14 DIAGNOSIS — Z3480 Encounter for supervision of other normal pregnancy, unspecified trimester: Secondary | ICD-10-CM

## 2021-10-14 DIAGNOSIS — Z3A20 20 weeks gestation of pregnancy: Secondary | ICD-10-CM

## 2021-10-14 NOTE — Progress Notes (Signed)
   LOW-RISK PREGNANCY VISIT Patient name: Aimee Sherman MRN [id number]  Date of birth: [birth year] Chief Complaint:   Routine Prenatal Visit  History of Present Illness:   Aimee Sherman is a 31 y.o. G82P2003 female at [redacted]w[redacted]d with an Estimated Date of Delivery: 03/02/22 being seen today for ongoing management of a low-risk pregnancy.      09/15/2021    2:04 PM  Depression screen PHQ 2/9  Decreased Interest 1  Down, Depressed, Hopeless 0  PHQ - 2 Score 1  Altered sleeping 0  Tired, decreased energy 1  Change in appetite 0  Feeling bad or failure about yourself  0  Trouble concentrating 0  Moving slowly or fidgety/restless 0  Suicidal thoughts 0  PHQ-9 Score 2    Today she reports no complaints. Contractions: Not present. Vag. Bleeding: None.  Movement: Present. denies leaking of fluid. Review of Systems:   Pertinent items are noted in HPI Denies abnormal vaginal discharge w/ itching/odor/irritation, headaches, visual changes, shortness of breath, chest pain, abdominal pain, severe nausea/vomiting, or problems with urination or bowel movements unless otherwise stated above. Pertinent History Reviewed:  Reviewed past medical,surgical, social, obstetrical and family history.  Reviewed problem list, medications and allergies.  Physical Assessment:   Vitals:   10/14/21 1216  BP: 92/62  Pulse: 81  Weight: 143 lb 9.6 oz (65.1 kg)  Body mass index is 31.41 kg/m.        Physical Examination:   General appearance: Well appearing, and in no distress  Mental status: Alert, oriented to person, place, and time  Skin: Warm & dry  Respiratory: Normal respiratory effort, no distress  Abdomen: Soft, gravid, nontender  Pelvic: Cervical exam deferred         Extremities: Edema: None  Psych:  mood and affect appropriate  Fetal Status:     Movement: Present    Chaperone: n/a    No results found for this or any previous visit (from the past 24 hour(s)).   Assessment  & Plan:  1) Low-risk pregnancy G4P2003 at [redacted]w[redacted]d with an Estimated Date of Delivery: 03/02/22   2) h/o Shoulder dystocia with clavical fx- []  plan for growth in 3rd trimester   Meds: No orders of the defined types were placed in this encounter.  Labs/procedures today: anatomy scan  Plan:  Continue routine obstetrical care  Next visit: prefers in person    Reviewed: Preterm labor symptoms and general obstetric precautions including but not limited to vaginal bleeding, contractions, leaking of fluid and fetal movement were reviewed in detail with the patient.  All questions were answered.   Follow-up: Return in about 4 weeks (around 11/11/2021) for LROB visit.  No orders of the defined types were placed in this encounter.   01/11/2022, DO Attending Obstetrician & Gynecologist, [facility] for [facility]

## 2021-10-14 NOTE — Progress Notes (Addendum)
[place] 20+1 wks,breech,cx 5.2 cm,anterior placenta gr 0,SVP of fluid 3.8 cm,FHR 135 bpm,normal ovaries,EFW 342 g 51%,anatomy complete,no obvious abnormalities

## 2021-11-10 ENCOUNTER — Ambulatory Visit (INDEPENDENT_AMBULATORY_CARE_PROVIDER_SITE_OTHER): Payer: BC Managed Care – PPO | Admitting: Advanced Practice Midwife

## 2021-11-10 ENCOUNTER — Encounter: Payer: Self-pay | Admitting: Advanced Practice Midwife

## 2021-11-10 VITALS — BP 113/67 | HR 93 | Wt 149.0 lb

## 2021-11-10 DIAGNOSIS — Z3480 Encounter for supervision of other normal pregnancy, unspecified trimester: Secondary | ICD-10-CM

## 2021-11-10 DIAGNOSIS — Z3A24 24 weeks gestation of pregnancy: Secondary | ICD-10-CM

## 2021-11-10 NOTE — Progress Notes (Signed)
   LOW-RISK PREGNANCY VISIT Patient name: Aimee Sherman MRN [id number]  Date of birth: 03-19-1990 Chief Complaint:   Routine Prenatal Visit  History of Present Illness:   Aimee Sherman is a 31 y.o. G73P2003 female at [redacted]w[redacted]d with an Estimated Date of Delivery: 03/02/22 being seen today for ongoing management of a low-risk pregnancy.  Today she reports no complaints. Contractions: Irritability. Vag. Bleeding: None.  Movement: Present. denies leaking of fluid. Review of Systems:   Pertinent items are noted in HPI Denies abnormal vaginal discharge w/ itching/odor/irritation, headaches, visual changes, shortness of breath, chest pain, abdominal pain, severe nausea/vomiting, or problems with urination or bowel movements unless otherwise stated above. Pertinent History Reviewed:  Reviewed past medical,surgical, social, obstetrical and family history.  Reviewed problem list, medications and allergies. Physical Assessment:   Vitals:   11/10/21 1014  BP: 113/67  Pulse: 93  Weight: 149 lb (67.6 kg)  Body mass index is 32.59 kg/m.        Physical Examination:   General appearance: Well appearing, and in no distress  Mental status: Alert, oriented to person, place, and time  Skin: Warm & dry  Cardiovascular: Normal heart rate noted  Respiratory: Normal respiratory effort, no distress  Abdomen: Soft, gravid, nontender  Pelvic: Cervical exam deferred         Extremities: Edema: Trace  Fetal Status: Fetal Heart Rate (bpm): 140 Fundal Height: 25 cm Movement: Present    Chaperone: n/a    No results found for this or any previous visit (from the past 24 hour(s)).  Assessment & Plan:  1) Low-risk pregnancy G4P2003 at [redacted]w[redacted]d with an Estimated Date of Delivery: 03/02/22   2) Occ BH, PTL precautions discussed   Meds: No orders of the defined types were placed in this encounter.  Labs/procedures today: none  Plan:  Continue routine obstetrical care  Next visit: prefers will  be in person for PN2     Reviewed: Preterm labor symptoms and general obstetric precautions including but not limited to vaginal bleeding, contractions, leaking of fluid and fetal movement were reviewed in detail with the patient.  All questions were answered. Given a home bp cuff. Check bp weekly, let us know if >140/90.   Follow-up: Return in about 3 weeks (around 12/01/2021) for PN2/LROB.  No future appointments.  No orders of the defined types were placed in this encounter.  Christin Fudge DNP, CNM 11/10/2021 10:45 AM

## 2021-11-10 NOTE — Patient Instructions (Signed)

## 2021-12-01 ENCOUNTER — Ambulatory Visit (INDEPENDENT_AMBULATORY_CARE_PROVIDER_SITE_OTHER): Payer: BC Managed Care – PPO | Admitting: Obstetrics & Gynecology

## 2021-12-01 ENCOUNTER — Other Ambulatory Visit: Payer: BC Managed Care – PPO

## 2021-12-01 ENCOUNTER — Encounter: Payer: Self-pay | Admitting: Obstetrics & Gynecology

## 2021-12-01 VITALS — BP 103/65 | HR 75 | Wt 152.2 lb

## 2021-12-01 DIAGNOSIS — Z131 Encounter for screening for diabetes mellitus: Secondary | ICD-10-CM | POA: Diagnosis not present

## 2021-12-01 DIAGNOSIS — Z3480 Encounter for supervision of other normal pregnancy, unspecified trimester: Secondary | ICD-10-CM | POA: Diagnosis not present

## 2021-12-01 DIAGNOSIS — Z23 Encounter for immunization: Secondary | ICD-10-CM

## 2021-12-01 DIAGNOSIS — Z3A26 26 weeks gestation of pregnancy: Secondary | ICD-10-CM

## 2021-12-01 DIAGNOSIS — Z3A27 27 weeks gestation of pregnancy: Secondary | ICD-10-CM

## 2021-12-01 NOTE — Progress Notes (Signed)
   LOW-RISK PREGNANCY VISIT Patient name: Aimee Sherman MRN [id number]  Date of birth: 1990/12/04 Chief Complaint:   Routine Prenatal Visit  History of Present Illness:   Aimee Sherman is a 31 y.o. G39P2003 female at [redacted]w[redacted]d with an Estimated Date of Delivery: 03/02/22 being seen today for ongoing management of a low-risk pregnancy.   -prior LGA with shoulder dystocia    12/01/2021    9:08 AM 09/15/2021    2:04 PM  Depression screen PHQ 2/9  Decreased Interest 0 1  Down, Depressed, Hopeless 0 0  PHQ - 2 Score 0 1  Altered sleeping 0 0  Tired, decreased energy 1 1  Change in appetite 0 0  Feeling bad or failure about yourself  0 0  Trouble concentrating 0 0  Moving slowly or fidgety/restless 0 0  Suicidal thoughts 0 0  PHQ-9 Score 1 2    Today she reports no complaints. Contractions: Irritability. Vag. Bleeding: None.  Movement: Present. denies leaking of fluid. Review of Systems:   Pertinent items are noted in HPI Denies abnormal vaginal discharge w/ itching/odor/irritation, headaches, visual changes, shortness of breath, chest pain, abdominal pain, severe nausea/vomiting, or problems with urination or bowel movements unless otherwise stated above. Pertinent History Reviewed:  Reviewed past medical,surgical, social, obstetrical and family history.  Reviewed problem list, medications and allergies.  Physical Assessment:   Vitals:   12/01/21 0907  BP: 103/65  Pulse: 75  Weight: 152 lb 4 oz (69.1 kg)  Body mass index is 33.3 kg/m.        Physical Examination:   General appearance: Well appearing, and in no distress  Mental status: Alert, oriented to person, place, and time  Skin: Warm & dry  Respiratory: Normal respiratory effort, no distress  Abdomen: Soft, gravid, nontender  Pelvic: Cervical exam deferred         Extremities: Edema: Trace  Psych:  mood and affect appropriate  Fetal Status: Fetal Heart Rate (bpm): 135 Fundal Height: 28 cm  Movement: Present    Chaperone: n/a    No results found for this or any previous visit (from the past 24 hour(s)).   Assessment & Plan:  1) Low-risk pregnancy G4P2003 at [redacted]w[redacted]d with an Estimated Date of Delivery: 03/02/22    Meds: No orders of the defined types were placed in this encounter.  Labs/procedures today: PN-2, Tdap given  Plan:  Continue routine obstetrical care  Next visit: prefers in person    Reviewed: Preterm labor symptoms and general obstetric precautions including but not limited to vaginal bleeding, contractions, leaking of fluid and fetal movement were reviewed in detail with the patient.  All questions were answered.   Follow-up: Return in about 3 weeks (around 12/22/2021) for [place] visit.  Orders Placed This Encounter  Procedures   Tdap vaccine greater than or equal to 7yo IM    Janyth Pupa, DO Attending [place], [facility] for [employer], [place]

## 2021-12-02 LAB — CBC
Hematocrit: 22.8 % — ABNORMAL LOW (ref 34.0–46.6)
Hemoglobin: 6.3 g/dL — CL (ref 11.1–15.9)
MCH: 19.5 pg — ABNORMAL LOW (ref 26.6–33.0)
MCHC: 27.6 g/dL — ABNORMAL LOW (ref 31.5–35.7)
MCV: 71 fL — ABNORMAL LOW (ref 79–97)
NRBC: 1 % — ABNORMAL HIGH (ref 0–0)
Platelets: 376 10*3/uL (ref 150–450)
RBC: 3.23 x10E6/uL — ABNORMAL LOW (ref 3.77–5.28)
RDW: 18 % — ABNORMAL HIGH (ref 11.7–15.4)
WBC: 7.5 10*3/uL (ref 3.4–10.8)

## 2021-12-02 LAB — HIV ANTIBODY (ROUTINE TESTING W REFLEX): HIV Screen 4th Generation wRfx: NONREACTIVE

## 2021-12-02 LAB — GLUCOSE TOLERANCE, 2 HOURS W/ 1HR
Glucose, 1 hour: 141 mg/dL (ref 70–179)
Glucose, 2 hour: 132 mg/dL (ref 70–152)
Glucose, Fasting: 87 mg/dL (ref 70–91)

## 2021-12-02 LAB — RPR: RPR Ser Ql: NONREACTIVE

## 2021-12-02 LAB — ANTIBODY SCREEN: Antibody Screen: NEGATIVE

## 2021-12-05 ENCOUNTER — Telehealth: Payer: Self-pay | Admitting: Obstetrics & Gynecology

## 2021-12-05 DIAGNOSIS — O99013 Anemia complicating pregnancy, third trimester: Secondary | ICD-10-CM

## 2021-12-05 DIAGNOSIS — O99012 Anemia complicating pregnancy, second trimester: Secondary | ICD-10-CM

## 2021-12-05 MED ORDER — FERROUS SULFATE 300 (60 FE) MG/5ML PO SOLN: 300.0000 mg | Freq: Every day | ORAL | 3 refills | Status: AC

## 2021-12-05 NOTE — Telephone Encounter (Signed)
Called patient with interpreter to review her lab results.  Reviewed hgb 6.3  She is currently asymptomatic and notes h/o low Hgb.  Discussed potential for either iron or blood transfusion.  Pt declined and reports they have offered this in the past, which she has declined.  She notes when she takes liquid iron it usually gets better.  Rx sent in for oral iron (liquid), plan to complete f/u lab work in 1 mos.  Pt aware that if there is not a significant improvement would advise transfusion.  Reviewed symptoms to watch out for (fatigue, dizziness, syncope)  Janyth Pupa, DO Attending [place], [place] for [employer], [place]

## 2021-12-22 ENCOUNTER — Encounter: Payer: Self-pay | Admitting: Medical

## 2021-12-22 ENCOUNTER — Ambulatory Visit (INDEPENDENT_AMBULATORY_CARE_PROVIDER_SITE_OTHER): Payer: BC Managed Care – PPO | Admitting: Medical

## 2021-12-22 VITALS — BP 106/55 | HR 96 | Wt 152.0 lb

## 2021-12-22 DIAGNOSIS — Z23 Encounter for immunization: Secondary | ICD-10-CM

## 2021-12-22 DIAGNOSIS — Z3A30 30 weeks gestation of pregnancy: Secondary | ICD-10-CM | POA: Diagnosis not present

## 2021-12-22 DIAGNOSIS — D508 Other iron deficiency anemias: Secondary | ICD-10-CM

## 2021-12-22 DIAGNOSIS — Z3480 Encounter for supervision of other normal pregnancy, unspecified trimester: Secondary | ICD-10-CM

## 2021-12-22 DIAGNOSIS — O09299 Supervision of pregnancy with other poor reproductive or obstetric history, unspecified trimester: Secondary | ICD-10-CM

## 2021-12-22 NOTE — Progress Notes (Signed)
   PRENATAL VISIT NOTE  Subjective:  Aimee Sherman is a 31 y.o. [id number] at [redacted]w[redacted]d being seen today for ongoing prenatal care.  She is currently monitored for the following issues for this low-risk pregnancy and has Encounter for supervision of normal pregnancy, antepartum; History of macrosomia in infant in prior pregnancy, currently pregnant; and Anemia on their problem list.  Patient reports occasional contractions.  Contractions: Not present. Vag. Bleeding: None.  Movement: Present. Denies leaking of fluid.   The following portions of the patient's history were reviewed and updated as appropriate: allergies, current medications, past family history, past medical history, past social history, past surgical history and problem list.   Objective:   Vitals:   12/22/21 0936  BP: (!) 106/55  Pulse: 96  Weight: 152 lb (68.9 kg)    Fetal Status:     Movement: Present     General:  Alert, oriented and cooperative. Patient is in no acute distress.  Skin: Skin is warm and dry. No rash noted.   Cardiovascular: Normal heart rate noted  Respiratory: Normal respiratory effort, no problems with respiration noted  Abdomen: Soft, gravid, appropriate for gestational age.  Pain/Pressure: Present     Pelvic: Cervical exam deferred        Extremities: Normal range of motion.  Edema: None  Mental Status: Normal mood and affect. Normal behavior. Normal judgment and thought content.   Assessment and Plan:  Pregnancy: G4P2003 at [redacted]w[redacted]d 1. Supervision of other normal pregnancy, antepartum - Flu Vaccine QUAD 36+ mos IM (Fluarix, Quad PF) - Planning to both breast and bottle feed  - Desires Depo Provera for PP MOC - Planning to use [facility] as peds  2. History of macrosomia in infant in prior pregnancy, currently pregnant - Growth [place] recommended 36-38 weeks   3. Other iron deficiency anemia - Taking PO iron, declined IV, will recheck at future visit   4. [redacted] weeks gestation of  pregnancy  Preterm labor symptoms and general obstetric precautions including but not limited to vaginal bleeding, contractions, leaking of fluid and fetal movement were reviewed in detail with the patient. Please refer to After Visit Summary for other counseling recommendations.   Return in about 2 weeks (around 01/05/2022) for LOB, In-Person, any provider.  No future appointments.  Vonzella Nipple, PA-C

## 2022-01-05 ENCOUNTER — Ambulatory Visit (INDEPENDENT_AMBULATORY_CARE_PROVIDER_SITE_OTHER): Payer: BC Managed Care – PPO | Admitting: Advanced Practice Midwife

## 2022-01-05 VITALS — BP 117/68 | HR 91 | Wt 156.0 lb

## 2022-01-05 DIAGNOSIS — Z3A32 32 weeks gestation of pregnancy: Secondary | ICD-10-CM

## 2022-01-05 DIAGNOSIS — D508 Other iron deficiency anemias: Secondary | ICD-10-CM

## 2022-01-05 DIAGNOSIS — Z3480 Encounter for supervision of other normal pregnancy, unspecified trimester: Secondary | ICD-10-CM

## 2022-01-05 NOTE — Patient Instructions (Signed)
Windell Hummingbird, I greatly value your feedback.  If you receive a survey following your visit with [place] today, we appreciate you taking the time to fill it out.  Thanks, Cathie Beams, DNP, CNM  [facility] HAS MOVED!!! It is now [facility] at [facility] ([address] [place], [place] [address]) Entrance located off of [address] Free 24/7 valet parking   Go to [email or web address] to register for FREE online childbirth classes    Call the office [phone]) or go to [facility] if: You begin to have strong, frequent contractions Your water breaks.  Sometimes it is a big gush of fluid, sometimes it is just a trickle that keeps getting your panties wet or running down your legs You have vaginal bleeding.  It is normal to have a small amount of spotting if your cervix was checked.  You don't feel your baby moving like normal.  If you don't, get you something to eat and drink and lay down and focus on feeling your baby move.  You should feel at least 10 movements in 2 hours.  If you don't, you should call the office or go to [facility].   Home Blood Pressure Monitoring for Patients   Your provider has recommended that you check your blood pressure (BP) at least once a week at home. If you do not have a blood pressure cuff at home, one will be provided for you. Contact your provider if you have not received your monitor within 1 week.   Helpful Tips for Accurate Home Blood Pressure Checks  Don't smoke, exercise, or drink caffeine 30 minutes before checking your BP Use the restroom before checking your BP (a full bladder can raise your pressure) Relax in a comfortable upright chair Feet on the ground Left arm resting comfortably on a flat surface at the level of your heart Legs uncrossed Back supported Sit quietly and don't talk Place the cuff on your bare arm Adjust snuggly, so that only two fingertips can fit between your skin  and the top of the cuff Check 2 readings separated by at least one minute Keep a log of your BP readings For a visual, please reference this diagram: http://ccnc.care/bpdiagram  Provider Name: [facility]     Phone: [phone]  Zone 1: ALL CLEAR  Continue to monitor your symptoms:  BP reading is less than 140 (top number) or less than 90 (bottom number)  No right upper stomach pain No headaches or seeing spots No feeling nauseated or throwing up No swelling in face and hands  Zone 2: CAUTION Call your doctor's office for any of the following:  BP reading is greater than 140 (top number) or greater than 90 (bottom number)  Stomach pain under your ribs in the middle or right side Headaches or seeing spots Feeling nauseated or throwing up Swelling in face and hands  Zone 3: EMERGENCY  Seek immediate medical care if you have any of the following:  BP reading is greater than160 (top number) or greater than 110 (bottom number) Severe headaches not improving with Tylenol Serious difficulty catching your breath Any worsening symptoms from Zone 2

## 2022-01-05 NOTE — Progress Notes (Signed)
   LOW-RISK PREGNANCY VISIT Patient name: Aimee Sherman MRN [id number]  Date of birth: 1991-01-17 Chief Complaint:   Routine Prenatal Visit  History of Present Illness:   Aimee Sherman is a 31 y.o. G38P2003 female at [redacted]w[redacted]d with an Estimated Date of Delivery: 03/02/22 being seen today for ongoing management of a low-risk pregnancy.  Today she reports  some pelvic pressure. Recommended mat belt . Contractions: Not present. Vag. Bleeding: None.  Movement: Present. denies leaking of fluid. Review of Systems:   Pertinent items are noted in HPI Denies abnormal vaginal discharge w/ itching/odor/irritation, headaches, visual changes, shortness of breath, chest pain, abdominal pain, severe nausea/vomiting, or problems with urination or bowel movements unless otherwise stated above. Pertinent History Reviewed:  Reviewed past medical,surgical, social, obstetrical and family history.  Reviewed problem list, medications and allergies. Physical Assessment:   Vitals:   01/05/22 1159  BP: 117/68  Pulse: 91  Weight: 156 lb (70.8 kg)  Body mass index is 34.12 kg/m.        Physical Examination:   General appearance: Well appearing, and in no distress  Mental status: Alert, oriented to person, place, and time  Skin: Warm & dry  Cardiovascular: Normal heart rate noted  Respiratory: Normal respiratory effort, no distress  Abdomen: Soft, gravid, nontender  Pelvic: Cervical exam deferred         Extremities:    Fetal Status: Fetal Heart Rate (bpm): 131 Fundal Height: 32 cm Movement: Present    Chaperone:  N/A    No results found for this or any previous visit (from the past 24 hour(s)).  Assessment & Plan:  1) Low-risk pregnancy G4P2003 at [redacted]w[redacted]d with an Estimated Date of Delivery: 03/02/22   2) severe anemia, offered IV iron, accepted.  Message sent to [facility] to schedule,  Added electrophoresis   Meds: No orders of the defined types were placed in this  encounter.  Labs/procedures today:   Plan:  Continue routine obstetrical care  Next visit: prefers in person    Reviewed: Preterm labor symptoms and general obstetric precautions including but not limited to vaginal bleeding, contractions, leaking of fluid and fetal movement were reviewed in detail with the patient.  All questions were answered. Has home bp cuff. Check bp weekly, let us know if >140/90.   Follow-up: Return in about 2 weeks (around 01/19/2022) for LROB; EFW/LROB 4 weeks.  Future Appointments  Date Time Provider Department Center  01/19/2022  3:50 PM Jacklyn Shell, CNM [facility]  02/07/2022 10:45 AM [facility] [place] [facility] None  02/07/2022 11:30 AM Lazaro Arms, MD [facility]    No orders of the defined types were placed in this encounter.  Jacklyn Shell DNP, CNM 01/05/2022 12:43 PM

## 2022-01-19 ENCOUNTER — Ambulatory Visit (INDEPENDENT_AMBULATORY_CARE_PROVIDER_SITE_OTHER): Payer: BC Managed Care – PPO | Admitting: Advanced Practice Midwife

## 2022-01-19 ENCOUNTER — Encounter: Payer: Self-pay | Admitting: Advanced Practice Midwife

## 2022-01-19 VITALS — BP 95/55 | HR 74 | Wt 159.0 lb

## 2022-01-19 DIAGNOSIS — Z3A34 34 weeks gestation of pregnancy: Secondary | ICD-10-CM

## 2022-01-19 DIAGNOSIS — Z3480 Encounter for supervision of other normal pregnancy, unspecified trimester: Secondary | ICD-10-CM

## 2022-01-19 NOTE — Progress Notes (Signed)
   LOW-RISK PREGNANCY VISIT Patient name: Aimee Sherman MRN [id number]  Date of birth: [birth year] Chief Complaint:   Routine Prenatal Visit  History of Present Illness:   Aimee Sherman is a 31 y.o. [phone] female at [redacted]w[redacted]d with an Estimated Date of Delivery: 03/02/22 being seen today for ongoing management of a low-risk pregnancy.  Today she reports no complaints. Contractions: Not present. Vag. Bleeding: None.  Movement: Present. denies leaking of fluid. Review of Systems:   Pertinent items are noted in HPI Denies abnormal vaginal discharge w/ itching/odor/irritation, headaches, visual changes, shortness of breath, chest pain, abdominal pain, severe nausea/vomiting, or problems with urination or bowel movements unless otherwise stated above. Pertinent History Reviewed:  Reviewed past medical,surgical, social, obstetrical and family history.  Reviewed problem list, medications and allergies. Physical Assessment:   Vitals:   01/19/22 1603  BP: (!) 95/55  Pulse: 74  Weight: 159 lb (72.1 kg)  Body mass index is 34.78 kg/m.        Physical Examination:   General appearance: Well appearing, and in no distress  Mental status: Alert, oriented to person, place, and time  Skin: Warm & dry  Cardiovascular: Normal heart rate noted  Respiratory: Normal respiratory effort, no distress  Abdomen: Soft, gravid, nontender  Pelvic: Cervical exam deferred         Extremities:    Fetal Status: Fetal Heart Rate (bpm): 142 Fundal Height: 34 cm Movement: Present    Chaperone:  N/A    No results found for this or any previous visit (from the past 24 hour(s)).  Assessment & Plan:  1) Low-risk pregnancy G4P2003 at [redacted]w[redacted]d with an Estimated Date of Delivery: 03/02/22   2) , Anemia of pregnancy, IV iron X 2 ordered  3)  Hx macrosomia w/clavical fx:  [place] for EFW scheduled   Meds: No orders of the defined types were placed in this encounter.  Labs/procedures today: IV venofer appt  message sent to Bayard Hugger, he will call her tomorrow.   Plan:  Continue routine obstetrical care  Next visit: prefers in person    Reviewed: Preterm labor symptoms and general obstetric precautions including but not limited to vaginal bleeding, contractions, leaking of fluid and fetal movement were reviewed in detail with the patient.  All questions were answered. Has home bp cuff.Check bp weekly, let us know if >140/90.   Follow-up: Return for As scheduled.  Future Appointments  Date Time Provider Department Center  02/07/2022 10:45 AM [facility] [place] [facility] None  02/07/2022 11:30 AM Lazaro Arms, MD [facility]    No orders of the defined types were placed in this encounter.  Jacklyn Shell DNP, CNM 01/19/2022 4:32 PM

## 2022-01-25 ENCOUNTER — Encounter (HOSPITAL_COMMUNITY)
Admission: RE | Admit: 2022-01-25 | Discharge: 2022-01-25 | Disposition: A | Discharge status: Home / Self Care | Payer: BC Managed Care – PPO | Source: Ambulatory Visit | Attending: Advanced Practice Midwife | Admitting: Advanced Practice Midwife

## 2022-01-25 DIAGNOSIS — Z3480 Encounter for supervision of other normal pregnancy, unspecified trimester: Secondary | ICD-10-CM | POA: Diagnosis not present

## 2022-01-25 DIAGNOSIS — D508 Other iron deficiency anemias: Secondary | ICD-10-CM | POA: Insufficient documentation

## 2022-01-25 MED ORDER — SODIUM CHLORIDE 0.9 % IV BOLUS: 500.0000 mL | Freq: Once | INTRAVENOUS | Status: DC | PRN

## 2022-01-25 MED ORDER — ACETAMINOPHEN 325 MG PO TABS
650.0000 mg | ORAL_TABLET | Freq: Once | ORAL | Status: AC
  Administered 2022-01-25: 650 mg via ORAL
  Filled 2022-01-25: qty 2

## 2022-01-25 MED ORDER — DIPHENHYDRAMINE HCL 50 MG/ML IJ SOLN: 25.0000 mg | Freq: Once | INTRAMUSCULAR | Status: DC | PRN

## 2022-01-25 MED ORDER — SODIUM CHLORIDE 0.9 % IV SOLN: INTRAVENOUS | Status: DC | PRN

## 2022-01-25 MED ORDER — METHYLPREDNISOLONE SODIUM SUCC 125 MG IJ SOLR: 125.0000 mg | Freq: Once | INTRAMUSCULAR | Status: DC | PRN

## 2022-01-25 MED ORDER — DIPHENHYDRAMINE HCL 25 MG PO CAPS
25.0000 mg | ORAL_CAPSULE | Freq: Once | ORAL | Status: AC
  Administered 2022-01-25: 25 mg via ORAL

## 2022-01-25 MED ORDER — SODIUM CHLORIDE 0.9 % IV SOLN
500.0000 mg | INTRAVENOUS | Status: DC
  Administered 2022-01-25: 500 mg via INTRAVENOUS
  Filled 2022-01-25: qty 25

## 2022-01-25 MED ORDER — EPINEPHRINE PF 1 MG/ML IJ SOLN: 0.3000 mg | Freq: Once | INTRAMUSCULAR | Status: DC | PRN

## 2022-01-25 MED ORDER — ALBUTEROL SULFATE (2.5 MG/3ML) 0.083% IN NEBU: 2.5000 mg | INHALATION_SOLUTION | Freq: Once | RESPIRATORY_TRACT | Status: DC | PRN

## 2022-01-25 NOTE — Progress Notes (Signed)
Diagnosis: Iron Deficiency Anemia  Provider:  Jacklyn Shell CNM  Procedure: Infusion  IV Type: Peripheral, IV Location: R Antecubital  Venofer (Iron Sucrose), Dose: 500 mg  Infusion Start Time: 0945 am  Infusion Stop Time: 1400 pm  Post Infusion IV Care: Observation period completed and Peripheral IV Discontinued  Discharge: Condition: Good, Destination: Home . AVS provided to patient.   Performed by:  Forrest Moron, RN

## 2022-02-01 ENCOUNTER — Encounter (HOSPITAL_COMMUNITY)
Admission: RE | Admit: 2022-02-01 | Discharge: 2022-02-01 | Disposition: A | Discharge status: Home / Self Care | Payer: BC Managed Care – PPO | Source: Ambulatory Visit | Attending: Advanced Practice Midwife | Admitting: Advanced Practice Midwife

## 2022-02-01 VITALS — BP 97/67 | HR 95 | Temp 98.4°F | Resp 16

## 2022-02-01 DIAGNOSIS — D508 Other iron deficiency anemias: Secondary | ICD-10-CM | POA: Diagnosis not present

## 2022-02-01 DIAGNOSIS — O99013 Anemia complicating pregnancy, third trimester: Secondary | ICD-10-CM

## 2022-02-01 DIAGNOSIS — Z3480 Encounter for supervision of other normal pregnancy, unspecified trimester: Secondary | ICD-10-CM | POA: Diagnosis not present

## 2022-02-01 MED ORDER — DIPHENHYDRAMINE HCL 25 MG PO CAPS
25.0000 mg | ORAL_CAPSULE | Freq: Once | ORAL | Status: AC
  Administered 2022-02-01: 25 mg via ORAL

## 2022-02-01 MED ORDER — ACETAMINOPHEN 325 MG PO TABS
650.0000 mg | ORAL_TABLET | Freq: Once | ORAL | Status: AC
  Administered 2022-02-01: 650 mg via ORAL

## 2022-02-01 MED ORDER — SODIUM CHLORIDE 0.9 % IV SOLN
500.0000 mg | Freq: Once | INTRAVENOUS | Status: AC
  Administered 2022-02-01: 500 mg via INTRAVENOUS
  Filled 2022-02-01: qty 25

## 2022-02-01 NOTE — Progress Notes (Signed)
Diagnosis: Iron Deficiency Anemia  Provider:  Jacklyn Shell CNM  Procedure: Infusion  IV Type: Peripheral, IV Location: L Antecubital  Venofer (Iron Sucrose), Dose: 500 mg  Infusion Start Time: 1002  Infusion Stop Time: 1349  Post Infusion IV Care: Observation period completed and Peripheral IV Discontinued  Discharge: Condition: Good, Destination: Home . AVS provided to patient.   Performed by:  Evelena Peat, RN

## 2022-02-02 ENCOUNTER — Other Ambulatory Visit: Payer: Self-pay | Admitting: Advanced Practice Midwife

## 2022-02-02 DIAGNOSIS — O09293 Supervision of pregnancy with other poor reproductive or obstetric history, third trimester: Secondary | ICD-10-CM

## 2022-02-06 NOTE — L&D Delivery Note (Addendum)
OB/GYN Faculty Practice Delivery Note  Aimee Sherman is a 32 y.o. [phone] s/p SVD at [redacted]w[redacted]d. She was admitted for SOL.   ROM: 2h 86m  GBS Status:  Negative/-- (01/02 1330) Maximum Maternal Temperature:  Temp (24hrs), Avg:98.7 F (37.1 C), Min:98.3 F (36.8 C), Max:99 F (37.2 C)    Labor Progress: Patient arrived at 4.5 cm dilation   Delivery Date/Time: 02/27/2022 at 2123 Delivery: Called to room and patient was complete and pushing. Head delivered in OA position. No nuchal cord present. Shoulder and body delivered in usual fashion. Infant with spontaneous cry, placed on mother's abdomen, dried and stimulated. Cord clamped x 2 after 1-minute delay, and cut by FOB. Cord blood drawn. Placenta delivered spontaneously with gentle cord traction. Fundus firm with massage and Pitocin. Labia, perineum, vagina, and cervix inspected.   Placenta: delivered spontaneously with gentle traction  Complications: none Lacerations: 1st degree perineal laceration, repaired for cosmetics w/3-0 vicryl EBL: 507 Analgesia: Epidural   Infant: APGAR (1 MIN): 8   APGAR (5 MINS): 8   APGAR (10 MINS):    Weight: pending  Abram Sander, MD  PGY-1,  Family Medicine  02/27/2022 10:00 PM   The above was performed under my direct supervision and guidance.

## 2022-02-07 ENCOUNTER — Encounter: Payer: Self-pay | Admitting: Obstetrics & Gynecology

## 2022-02-07 ENCOUNTER — Ambulatory Visit (INDEPENDENT_AMBULATORY_CARE_PROVIDER_SITE_OTHER): Payer: BC Managed Care – PPO

## 2022-02-07 ENCOUNTER — Other Ambulatory Visit (HOSPITAL_COMMUNITY)
Admission: RE | Admit: 2022-02-07 | Discharge: 2022-02-07 | Disposition: A | Discharge status: Home / Self Care | Payer: BC Managed Care – PPO | Source: Ambulatory Visit | Attending: Obstetrics & Gynecology | Admitting: Obstetrics & Gynecology

## 2022-02-07 ENCOUNTER — Ambulatory Visit (INDEPENDENT_AMBULATORY_CARE_PROVIDER_SITE_OTHER): Payer: BC Managed Care – PPO | Admitting: Obstetrics & Gynecology

## 2022-02-07 VITALS — BP 99/69 | HR 77 | Wt 156.0 lb

## 2022-02-07 DIAGNOSIS — Z3480 Encounter for supervision of other normal pregnancy, unspecified trimester: Secondary | ICD-10-CM

## 2022-02-07 DIAGNOSIS — O09293 Supervision of pregnancy with other poor reproductive or obstetric history, third trimester: Secondary | ICD-10-CM | POA: Diagnosis not present

## 2022-02-07 DIAGNOSIS — Z3A36 36 weeks gestation of pregnancy: Secondary | ICD-10-CM

## 2022-02-07 DIAGNOSIS — D508 Other iron deficiency anemias: Secondary | ICD-10-CM | POA: Diagnosis not present

## 2022-02-07 LAB — POCT HEMOGLOBIN: Hemoglobin: 9 g/dL — AB (ref 11–14.6)

## 2022-02-07 NOTE — Progress Notes (Signed)
   LOW-RISK PREGNANCY VISIT Patient name: Aimee Sherman MRN [id number]  Date of birth: 11/14/1990 Chief Complaint:   Routine Prenatal Visit ([place] today; GBS, GC/CHL)  History of Present Illness:   Aimee Sherman is a 32 y.o. G63P2003 female at [redacted]w[redacted]d with an Estimated Date of Delivery: 03/02/22 being seen today for ongoing management of a low-risk pregnancy.     12/01/2021    9:08 AM 09/15/2021    2:04 PM  Depression screen PHQ 2/9  Decreased Interest 0 1  Down, Depressed, Hopeless 0 0  PHQ - 2 Score 0 1  Altered sleeping 0 0  Tired, decreased energy 1 1  Change in appetite 0 0  Feeling bad or failure about yourself  0 0  Trouble concentrating 0 0  Moving slowly or fidgety/restless 0 0  Suicidal thoughts 0 0  PHQ-9 Score 1 2    Today she reports no complaints. Contractions: Irritability. Vag. Bleeding: None.  Movement: Present. denies leaking of fluid. Review of Systems:   Pertinent items are noted in HPI Denies abnormal vaginal discharge w/ itching/odor/irritation, headaches, visual changes, shortness of breath, chest pain, abdominal pain, severe nausea/vomiting, or problems with urination or bowel movements unless otherwise stated above. Pertinent History Reviewed:  Reviewed past medical,surgical, social, obstetrical and family history.  Reviewed problem list, medications and allergies. Physical Assessment:   Vitals:   02/07/22 1136  BP: 99/69  Pulse: 77  Weight: 156 lb (70.8 kg)  Body mass index is 34.12 kg/m.        Physical Examination:   General appearance: Well appearing, and in no distress  Mental status: Alert, oriented to person, place, and time  Skin: Warm & dry  Cardiovascular: Normal heart rate noted  Respiratory: Normal respiratory effort, no distress  Abdomen: Soft, gravid, nontender  Pelvic: Cervical exam performed  LTC        Extremities:    Fetal Status:     Movement: Present    Chaperone: Levy Pupa    No results found for  this or any previous visit (from the past 24 hour(s)).  Assessment & Plan:  1) Low-risk pregnancy G4P2003 at [redacted]w[redacted]d with an Estimated Date of Delivery: 03/02/22     ICD-10-CM   1. Supervision of other normal pregnancy, antepartum  Z34.80 Cervicovaginal ancillary only( [place])    Strep Gp B NAA    2. [redacted] weeks gestation of pregnancy  Z3A.36 Cervicovaginal ancillary only( [place])    Strep Gp B NAA    3. Severe iron deficiency anemia  D50.8    hemoglobin today: 9        Meds: No orders of the defined types were placed in this encounter.  Labs/procedures today: Hemoglobin  Plan:  Continue routine obstetrical care  Next visit: prefers in person     Follow-up: Return in about 1 week (around 02/14/2022) for [place].  Orders Placed This Encounter  Procedures   Strep Gp B NAA    Florian Buff, MD 02/07/2022 12:06 PM

## 2022-02-07 NOTE — Progress Notes (Signed)
[place] 36+5 wks,anterior placenta gr 3,AFI 8.3 cm,FHR 119 BPM,EFW 2909 g 44%

## 2022-02-08 LAB — CERVICOVAGINAL ANCILLARY ONLY
Chlamydia: NEGATIVE
Comment: NEGATIVE
Comment: NORMAL
Neisseria Gonorrhea: NEGATIVE

## 2022-02-09 LAB — STREP GP B NAA: Strep Gp B NAA: NEGATIVE

## 2022-02-13 ENCOUNTER — Ambulatory Visit (INDEPENDENT_AMBULATORY_CARE_PROVIDER_SITE_OTHER): Payer: BC Managed Care – PPO | Admitting: Advanced Practice Midwife

## 2022-02-13 ENCOUNTER — Encounter: Payer: Self-pay | Admitting: Advanced Practice Midwife

## 2022-02-13 VITALS — BP 103/68 | HR 74 | Wt 158.0 lb

## 2022-02-13 DIAGNOSIS — Z3A37 37 weeks gestation of pregnancy: Secondary | ICD-10-CM

## 2022-02-13 DIAGNOSIS — Z3480 Encounter for supervision of other normal pregnancy, unspecified trimester: Secondary | ICD-10-CM

## 2022-02-13 NOTE — Progress Notes (Signed)
   LOW-RISK PREGNANCY VISIT Patient name: Aimee Sherman MRN [id number]  Date of birth: 18-Dec-1990 Chief Complaint:   Routine Prenatal Visit (contractions)  History of Present Illness:   Aimee Sherman is a 32 y.o. G52P2003 female at [redacted]w[redacted]d with an Estimated Date of Delivery: 03/02/22 being seen today for ongoing management of a low-risk pregnancy.  Today she reports fatigue. Contractions: Irregular.  .  Movement: Present. denies leaking of fluid. Review of Systems:   Pertinent items are noted in HPI Denies abnormal vaginal discharge w/ itching/odor/irritation, headaches, visual changes, shortness of breath, chest pain, abdominal pain, severe nausea/vomiting, or problems with urination or bowel movements unless otherwise stated above. Pertinent History Reviewed:  Reviewed past medical,surgical, social, obstetrical and family history.  Reviewed problem list, medications and allergies. Physical Assessment:   Vitals:   02/13/22 0925  BP: 103/68  Pulse: 74  Weight: 158 lb (71.7 kg)  Body mass index is 34.56 kg/m.        Physical Examination:   General appearance: Well appearing, and in no distress  Mental status: Alert, oriented to person, place, and time  Skin: Warm & dry  Cardiovascular: Normal heart rate noted  Respiratory: Normal respiratory effort, no distress  Abdomen: Soft, gravid, nontender  Pelvic: Cervical exam performed  Dilation: 1 Effacement (%): Thick Station: Ballotable  Extremities: Edema: None  Fetal Status: Fetal Heart Rate (bpm): 142 Fundal Height: 37 cm Movement: Present Presentation: Vertex  Chaperone:   interpreter     No results found for this or any previous visit (from the past 24 hour(s)).  Assessment & Plan:  1) Low-risk pregnancy G4P2003 at [redacted]w[redacted]d with an Estimated Date of Delivery: 03/02/22   2) Anemia, Hgb up to 9 after Fe infusion.  Continue PO    Meds: No orders of the defined types were placed in this  encounter.  Labs/procedures today: none  Plan:  Continue routine obstetrical care  Next visit: prefers in person    Reviewed: Term labor symptoms and general obstetric precautions including but not limited to vaginal bleeding, contractions, leaking of fluid and fetal movement were reviewed in detail with the patient.  All questions were answered. Has home bp cuff.. Check bp weekly, let us know if >140/90.   Follow-up: No follow-ups on file.  Future Appointments  Date Time Provider [place]  02/20/2022 11:10 AM Roma Schanz, CNM [facility]  02/27/2022  8:30 AM Cresenzo-Dishmon, Joaquim Lai, CNM [facility]    No orders of the defined types were placed in this encounter.  Christin Fudge DNP, CNM 02/13/2022 9:56 AM

## 2022-02-20 ENCOUNTER — Encounter: Payer: Self-pay | Admitting: Women's Health

## 2022-02-20 ENCOUNTER — Ambulatory Visit (INDEPENDENT_AMBULATORY_CARE_PROVIDER_SITE_OTHER): Payer: BC Managed Care – PPO | Admitting: Women's Health

## 2022-02-20 VITALS — BP 104/65 | HR 75 | Wt 163.5 lb

## 2022-02-20 DIAGNOSIS — Z8759 Personal history of other complications of pregnancy, childbirth and the puerperium: Secondary | ICD-10-CM

## 2022-02-20 DIAGNOSIS — Z3483 Encounter for supervision of other normal pregnancy, third trimester: Secondary | ICD-10-CM

## 2022-02-20 DIAGNOSIS — Z3480 Encounter for supervision of other normal pregnancy, unspecified trimester: Secondary | ICD-10-CM

## 2022-02-20 NOTE — Progress Notes (Signed)
LOW-RISK PREGNANCY VISIT Patient name: Aimee Sherman MRN [id number]  Date of birth: 10/14/1990 Chief Complaint:   Routine Prenatal Visit (+ contractions)  History of Present Illness:   Aimee Sherman is a 32 y.o. G4P2003 female at [redacted]w[redacted]d with an Estimated Date of Delivery: 03/02/22 being seen today for ongoing management of a low-risk pregnancy.   Today she reports occasional contractions since Thursday, q 35mins, sometimes q 61min then spaces back out.  Contractions: Irregular. Vag. Bleeding: None.  Movement: Present. denies leaking of fluid.     12/01/2021    9:08 AM 09/15/2021    2:04 PM  Depression screen PHQ 2/9  Decreased Interest 0 1  Down, Depressed, Hopeless 0 0  PHQ - 2 Score 0 1  Altered sleeping 0 0  Tired, decreased energy 1 1  Change in appetite 0 0  Feeling bad or failure about yourself  0 0  Trouble concentrating 0 0  Moving slowly or fidgety/restless 0 0  Suicidal thoughts 0 0  PHQ-9 Score 1 2        12/01/2021    9:08 AM 09/15/2021    2:11 PM  GAD 7 : Generalized Anxiety Score  Nervous, Anxious, on Edge 0 0  Control/stop worrying 0 0  Worry too much - different things 0 0  Trouble relaxing 0 0  Restless 0 0  Easily annoyed or irritable 0 0  Afraid - awful might happen 0 0  Total GAD 7 Score 0 0      Review of Systems:   Pertinent items are noted in HPI Denies abnormal vaginal discharge w/ itching/odor/irritation, headaches, visual changes, shortness of breath, chest pain, abdominal pain, severe nausea/vomiting, or problems with urination or bowel movements unless otherwise stated above. Pertinent History Reviewed:  Reviewed past medical,surgical, social, obstetrical and family history.  Reviewed problem list, medications and allergies. Physical Assessment:   Vitals:   02/20/22 1116  BP: 104/65  Pulse: 75  Weight: 163 lb 8 oz (74.2 kg)  Body mass index is 35.77 kg/m.        Physical Examination:   General appearance:  Well appearing, and in no distress  Mental status: Alert, oriented to person, place, and time  Skin: Warm & dry  Cardiovascular: Normal heart rate noted  Respiratory: Normal respiratory effort, no distress  Abdomen: Soft, gravid, nontender  Pelvic: Cervical exam performed  Dilation: 1 Effacement (%): Thick Station: Ballotable  Extremities:    Fetal Status: Fetal Heart Rate (bpm): 130 Fundal Height: 38 cm Movement: Present Presentation: Vertex  Chaperone:  Spanish intepreter    No results found for this or any previous visit (from the past 24 hour(s)).  Assessment & Plan:  1) Low-risk pregnancy G4P2003 at [redacted]w[redacted]d with an Estimated Date of Delivery: 03/02/22   2) Occasional contractions, discussed labor s/s, reasons to seek care  3) H/O 2.53min shoulder dystocia> 10lb11oz baby, no GDM, clavicle fx. EFW 44% @ 36wks  4) Severe anemia> hgb 6.3 (10/26), s/p IV iron, continues po iron, last hgb 9 (1/2)   Meds: No orders of the defined types were placed in this encounter.  Labs/procedures today: SVE  Plan:  Continue routine obstetrical care  Next visit: prefers in person    Reviewed: Term labor symptoms and general obstetric precautions including but not limited to vaginal bleeding, contractions, leaking of fluid and fetal movement were reviewed in detail with the patient.  All questions were answered. Does have home bp cuff. Office bp cuff given:  not applicable. Check bp weekly, let us know if consistently >140 and/or >90.  Follow-up: Return for As scheduled.  Future Appointments  Date Time Provider [place]  02/27/2022  8:30 AM Cresenzo-Dishmon, Joaquim Lai, CNM [facility]    No orders of the defined types were placed in this encounter.  [place], [facility] 02/20/2022 11:38 AM

## 2022-02-20 NOTE — Patient Instructions (Signed)
Aimee Sherman, thank you for choosing our office today! We appreciate the opportunity to meet your healthcare needs. You may receive a short survey by mail, e-mail, or through [employer]. If you are happy with your care we would appreciate if you could take just a few minutes to complete the survey questions. We read all of your comments and take your feedback very seriously. Thank you again for choosing our office.  Center for [employer] Team at [place] at [facility] ([place], [place] [address]) Entrance C, located off of [address] parking   CLASSES: Go to [email or web address] to register for classes (childbirth, breastfeeding, waterbirth, infant CPR, daddy bootcamp, etc.)  Call the office [phone]) or go to [facility] if: You begin to have strong, frequent contractions Your water breaks.  Sometimes it is a big gush of fluid, sometimes it is just a trickle that keeps getting your panties wet or running down your legs You have vaginal bleeding.  It is normal to have a small amount of spotting if your cervix was checked.  You don't feel your baby moving like normal.  If you don't, get you something to eat and drink and lay down and focus on feeling your baby move.   If your baby is still not moving like normal, you should call the office or go to [facility].  Call the office [phone]) or go to [facility] hospital for these signs of pre-eclampsia: Severe headache that does not go away with Tylenol Visual changes- seeing spots, double, blurred vision Pain under your right breast or upper abdomen that does not go away with Tums or heartburn medicine Nausea and/or vomiting Severe swelling in your hands, feet, and face   [facility] Pediatricians/Family Doctors [facility]): [address] [place], [place]: [address] [place], [place] [facility]): Register, [phone] (call to ask if accepting patients) [facility]: [address] [facility], [facility]): [address], [facility]: [address] [place], [place] [facility]: [place], [place] [facility]): [phone] [facility]: [address], [place]: [address], [place] [facility]: [phone], [phone]  Home Blood Pressure Monitoring for Patients   Your provider has recommended that you check your blood pressure (BP) at least once a week at home. If you do not have a blood pressure cuff at home, one will be provided for you. Contact your provider if you have not received your monitor within 1 week.   Helpful Tips for Accurate Home Blood Pressure Checks  Don't smoke, exercise, or drink caffeine 30 minutes before checking your BP Use the restroom before checking your BP (a full bladder can raise your pressure) Relax in a comfortable upright chair Feet on the ground Left arm resting comfortably on a flat surface at the level of your heart Legs uncrossed Back supported Sit quietly and don't talk Place the cuff on your bare arm Adjust snuggly, so that only two fingertips  can fit between your skin and the top of the cuff Check 2 readings separated by at least one minute Keep a log of your BP readings For a visual, please reference this diagram: http://ccnc.care/bpdiagram  Provider Name: [facility]     Phone: [phone]  Zone 1: ALL CLEAR  Continue to monitor your symptoms:  BP reading is less than 140 (top number) or less than 90 (bottom number)  No right  upper stomach pain No headaches or seeing spots No feeling nauseated or throwing up No swelling in face and hands  Zone 2: CAUTION Call your doctor's office for any of the following:  BP reading is greater than 140 (top number) or greater than 90 (bottom number)  Stomach pain under your ribs in the middle or right side Headaches or seeing spots Feeling nauseated or throwing up Swelling in face and hands  Zone 3: EMERGENCY  Seek immediate medical care if you have any of the following:  BP reading is greater than160 (top number) or greater than 110 (bottom number) Severe headaches not improving with Tylenol Serious difficulty catching your breath Any worsening symptoms from Zone 2   Contracciones de Braxton Hicks Braxton [employer] contracciones del tero pueden presentarse durante todo el [place], [place] no siempre indican que la mujer est en [place]. Es posible que usted haya tenido contracciones de prctica llamadas contracciones de [place]. A veces, a estas falsas contracciones de trabajo de parto se las confunde con el verdadero trabajo de [place]. Qu son las contracciones de [place]? Las contracciones de [place] son espasmos que se producen en los msculos del tero antes del [place] de [place]. A diferencia de las contracciones del trabajo de parto verdadero, estas no producen el agrandamiento (la dilatacin) ni el afinamiento de la parte ms baja del tero (cuello uterino). Hacia el final del embarazo (entre las semanas 32 y 34), las contracciones de Braxton Hicks pueden presentarse ms seguido y tornarse ms intensas. A veces, resulta difcil distinguirlas del trabajo de parto verdadero porque pueden ser [employer]. Cmo diferenciar el Aleen Campi de parto falso del verdadero Trabajo de parto verdadero Las contracciones duran de 30 a 70 segundos. Las contracciones pueden tornarse muy regulares. La molestia generalmente se siente en la parte  superior del tero y se extiende hacia la zona baja del abdomen y [employer] cintura. Las contracciones no desaparecen cuando usted camina. Las contracciones suelen tornarse ms fuertes y ms frecuentes. El cuello uterino se dilata y se afina. Trabajo de parto falso En general, las contracciones de [place] son ms cortas, ms dbiles y con ms [employer] una y otra que las contracciones del trabajo de parto verdadero. En general, las contracciones son irregulares. A menudo, las contracciones se sienten en la parte delantera de la parte baja del abdomen y en la ingle. Las Futures trader cuando usted camina o [place] de posicin mientras est [place]. En general, el cuello uterino no se dilata ni se afina. En ocasiones, la nica forma de saber si el trabajo de parto es verdadero es que el mdico determine si hay cambios en el cuello uterino. El [employer] har un examen fsico y Development worker, international aid las contracciones. Si entr en trabajo de parto verdadero, el mdico la enviar a casa con instrucciones acerca de cundo debe regresar al hospital. Es posible que las contracciones de Braxton Hicks continen hasta que se desencadene el trabajo de parto verdadero. Siga estas instrucciones en su  casa:  Use los medicamentos de venta libre y los recetados solamente como se lo haya indicado el mdico. Si las contracciones de Braxton Hicks le provocan incomodidad: Cambie de posicin: si est acostada o descansando, camine; si est caminando, descanse. Sintese y descanse en una baera con agua tibia. Beba suficiente lquido como para Theatre manager la orina de color [place] plido. La deshidratacin puede provocar contracciones. Respire lenta y profundamente varias veces por hora. Concurra a [place]. Esto es importante. Comunquese con un mdico si: Tiene fiebre. Siente dolor constante en el abdomen. Las contracciones se intensifican, se hacen ms regulares y  Industrial/product designer s. Elimina mucosidad manchada con [place]. Solicite ayuda de inmediato si: Tiene una prdida de lquido por la vagina. Elimina cogulos de sangre de color rojo brillante por la vagina. El beb no se mueve tanto como antes. Resumen Es posible que usted haya tenido contracciones de prctica llamadas contracciones de [place]. A veces, a estas falsas contracciones de trabajo de parto se las confunde con el verdadero trabajo de [place]. En general, las contracciones de Braxton Hicks son ms cortas, ms dbiles, con ms tiempo entre una y [place], y menos regulares que las contracciones del trabajo de parto verdadero. Las contracciones del trabajo de parto verdadero se intensifican ms y se tornan ms regulares y ms frecuentes. Controle las [employer] que producen las contracciones de Braxton Hicks al cambiar de posicin, Production assistant, radio en un bao tibio, practicar la respiracin profunda y beber mucha agua. Concurra a [place]. Comunquese con el mdico si sus contracciones se tornan ms fuertes, ms regulares y con [employer] y [place]. Esta informacin no tiene Marine scientist el consejo del mdico. Asegrese de hacerle al mdico cualquier pregunta que tenga. Document Revised: 12/30/2019 Document Reviewed: 12/23/2019 Elsevier Patient Education  [place].

## 2022-02-27 ENCOUNTER — Other Ambulatory Visit: Payer: Self-pay

## 2022-02-27 ENCOUNTER — Ambulatory Visit (INDEPENDENT_AMBULATORY_CARE_PROVIDER_SITE_OTHER): Payer: BC Managed Care – PPO | Admitting: Advanced Practice Midwife

## 2022-02-27 ENCOUNTER — Telehealth (HOSPITAL_COMMUNITY): Payer: Self-pay | Admitting: *Deleted

## 2022-02-27 ENCOUNTER — Inpatient Hospital Stay (HOSPITAL_COMMUNITY): Payer: BC Managed Care – PPO | Admitting: Anesthesiology

## 2022-02-27 ENCOUNTER — Encounter (HOSPITAL_COMMUNITY): Payer: Self-pay | Admitting: Family Medicine

## 2022-02-27 ENCOUNTER — Inpatient Hospital Stay (HOSPITAL_COMMUNITY)
Admission: AD | Admit: 2022-02-27 | Discharge: 2022-03-01 | DRG: 807 | Disposition: A | Discharge status: Home / Self Care | Payer: BC Managed Care – PPO | Attending: Obstetrics and Gynecology | Admitting: Obstetrics and Gynecology

## 2022-02-27 ENCOUNTER — Encounter: Payer: Self-pay | Admitting: Advanced Practice Midwife

## 2022-02-27 VITALS — BP 121/77 | HR 73 | Wt 159.0 lb

## 2022-02-27 DIAGNOSIS — Z3483 Encounter for supervision of other normal pregnancy, third trimester: Secondary | ICD-10-CM

## 2022-02-27 DIAGNOSIS — O26893 Other specified pregnancy related conditions, third trimester: Secondary | ICD-10-CM | POA: Diagnosis not present

## 2022-02-27 DIAGNOSIS — D649 Anemia, unspecified: Secondary | ICD-10-CM | POA: Diagnosis not present

## 2022-02-27 DIAGNOSIS — Z3480 Encounter for supervision of other normal pregnancy, unspecified trimester: Secondary | ICD-10-CM

## 2022-02-27 DIAGNOSIS — O9902 Anemia complicating childbirth: Principal | ICD-10-CM | POA: Diagnosis present

## 2022-02-27 DIAGNOSIS — Z3A39 39 weeks gestation of pregnancy: Secondary | ICD-10-CM

## 2022-02-27 DIAGNOSIS — Z23 Encounter for immunization: Secondary | ICD-10-CM | POA: Diagnosis not present

## 2022-02-27 LAB — CBC
HCT: 33.3 % — ABNORMAL LOW (ref 36.0–46.0)
Hemoglobin: 10.4 g/dL — ABNORMAL LOW (ref 12.0–15.0)
MCH: 26.1 pg (ref 26.0–34.0)
MCHC: 31.2 g/dL (ref 30.0–36.0)
MCV: 83.7 fL (ref 80.0–100.0)
Platelets: 300 10*3/uL (ref 150–400)
RBC: 3.98 MIL/uL (ref 3.87–5.11)
RDW: 28.2 % — ABNORMAL HIGH (ref 11.5–15.5)
WBC: 13.4 10*3/uL — ABNORMAL HIGH (ref 4.0–10.5)
nRBC: 0 % (ref 0.0–0.2)

## 2022-02-27 LAB — TYPE AND SCREEN
ABO/RH(D): O POS
Antibody Screen: NEGATIVE

## 2022-02-27 MED ORDER — OXYTOCIN-SODIUM CHLORIDE 30-0.9 UT/500ML-% IV SOLN
2.5000 [IU]/h | INTRAVENOUS | Status: DC
  Filled 2022-02-27: qty 500

## 2022-02-27 MED ORDER — OXYCODONE-ACETAMINOPHEN 5-325 MG PO TABS: 1.0000 | ORAL_TABLET | ORAL | Status: DC | PRN

## 2022-02-27 MED ORDER — LACTATED RINGERS IV SOLN: 500.0000 mL | INTRAVENOUS | Status: DC | PRN

## 2022-02-27 MED ORDER — SOD CITRATE-CITRIC ACID 500-334 MG/5ML PO SOLN: 30.0000 mL | ORAL | Status: DC | PRN

## 2022-02-27 MED ORDER — MISOPROSTOL 200 MCG PO TABS
800.0000 ug | ORAL_TABLET | Freq: Once | ORAL | Status: AC
  Administered 2022-02-27: 800 ug via RECTAL

## 2022-02-27 MED ORDER — PHENYLEPHRINE 80 MCG/ML (10ML) SYRINGE FOR IV PUSH (FOR BLOOD PRESSURE SUPPORT)
80.0000 ug | PREFILLED_SYRINGE | INTRAVENOUS | Status: DC | PRN
  Filled 2022-02-27: qty 10

## 2022-02-27 MED ORDER — MISOPROSTOL 200 MCG PO TABS
ORAL_TABLET | ORAL | Status: AC
  Filled 2022-02-27: qty 4

## 2022-02-27 MED ORDER — FENTANYL-BUPIVACAINE-NACL 0.5-0.125-0.9 MG/250ML-% EP SOLN
12.0000 mL/h | EPIDURAL | Status: DC | PRN
  Administered 2022-02-27: 12 mL/h via EPIDURAL
  Filled 2022-02-27: qty 250

## 2022-02-27 MED ORDER — LACTATED RINGERS IV SOLN: INTRAVENOUS | Status: DC

## 2022-02-27 MED ORDER — DIPHENHYDRAMINE HCL 50 MG/ML IJ SOLN: 12.5000 mg | INTRAMUSCULAR | Status: DC | PRN

## 2022-02-27 MED ORDER — LIDOCAINE HCL (PF) 1 % IJ SOLN: 30.0000 mL | INTRAMUSCULAR | Status: DC | PRN

## 2022-02-27 MED ORDER — TRANEXAMIC ACID-NACL 1000-0.7 MG/100ML-% IV SOLN
INTRAVENOUS | Status: AC
  Filled 2022-02-27: qty 100

## 2022-02-27 MED ORDER — LIDOCAINE HCL (PF) 1 % IJ SOLN
INTRAMUSCULAR | Status: DC | PRN
  Administered 2022-02-27: 8 mL via EPIDURAL

## 2022-02-27 MED ORDER — TRANEXAMIC ACID-NACL 1000-0.7 MG/100ML-% IV SOLN
1000.0000 mg | Freq: Once | INTRAVENOUS | Status: AC
  Administered 2022-02-27: 1000 mg via INTRAVENOUS

## 2022-02-27 MED ORDER — FLEET ENEMA 7-19 GM/118ML RE ENEM: 1.0000 | ENEMA | RECTAL | Status: DC | PRN

## 2022-02-27 MED ORDER — ACETAMINOPHEN 325 MG PO TABS: 650.0000 mg | ORAL_TABLET | ORAL | Status: DC | PRN

## 2022-02-27 MED ORDER — ONDANSETRON HCL 4 MG/2ML IJ SOLN: 4.0000 mg | Freq: Four times a day (QID) | INTRAMUSCULAR | Status: DC | PRN

## 2022-02-27 MED ORDER — EPHEDRINE 5 MG/ML INJ: 10.0000 mg | INTRAVENOUS | Status: DC | PRN

## 2022-02-27 MED ORDER — FENTANYL-BUPIVACAINE-NACL 0.5-0.125-0.9 MG/250ML-% EP SOLN: 12.0000 mL/h | EPIDURAL | Status: DC | PRN

## 2022-02-27 MED ORDER — HYDROXYZINE HCL 50 MG PO TABS: 50.0000 mg | ORAL_TABLET | Freq: Four times a day (QID) | ORAL | Status: DC | PRN

## 2022-02-27 MED ORDER — LACTATED RINGERS IV SOLN: 500.0000 mL | Freq: Once | INTRAVENOUS | Status: DC

## 2022-02-27 MED ORDER — OXYTOCIN BOLUS FROM INFUSION
333.0000 mL | Freq: Once | INTRAVENOUS | Status: AC
  Administered 2022-02-27: 333 mL via INTRAVENOUS

## 2022-02-27 MED ORDER — FENTANYL CITRATE (PF) 100 MCG/2ML IJ SOLN: 100.0000 ug | INTRAMUSCULAR | Status: DC | PRN

## 2022-02-27 MED ORDER — OXYCODONE-ACETAMINOPHEN 5-325 MG PO TABS: 2.0000 | ORAL_TABLET | ORAL | Status: DC | PRN

## 2022-02-27 MED ORDER — PHENYLEPHRINE 80 MCG/ML (10ML) SYRINGE FOR IV PUSH (FOR BLOOD PRESSURE SUPPORT): 80.0000 ug | PREFILLED_SYRINGE | INTRAVENOUS | Status: DC | PRN

## 2022-02-27 NOTE — Anesthesia Procedure Notes (Signed)
Epidural Patient location during procedure: OB Start time: 02/27/2022 5:10 PM End time: 02/27/2022 5:13 PM  Staffing Anesthesiologist: Janeece Riggers, MD Performed: resident/CRNA   Preanesthetic Checklist Completed: patient identified, IV checked, site marked, risks and benefits discussed, surgical consent, monitors and equipment checked, pre-op evaluation and timeout performed  Epidural Patient position: sitting Prep: DuraPrep and site prepped and draped Patient monitoring: continuous pulse ox and blood pressure Approach: midline Location: L3-L4 Injection technique: LOR air  Needle:  Needle type: Tuohy  Needle gauge: 17 G Needle length: 9 cm and 9 Needle insertion depth: 6 cm Catheter type: closed end flexible Catheter size: 19 Gauge Catheter at skin depth: 12 cm Test dose: negative  Assessment Events: blood not aspirated, no cerebrospinal fluid, injection not painful, no injection resistance, no paresthesia and negative IV test

## 2022-02-27 NOTE — Telephone Encounter (Signed)
Preadmission screen Interpreter number  [phone]

## 2022-02-27 NOTE — H&P (Cosign Needed Addendum)
OBSTETRIC ADMISSION HISTORY AND PHYSICAL  Kizzie Cotten is a 32 y.o. female [phone] with IUP at [redacted]w[redacted]d by [redacted]w[redacted]d [place] on 08/24/2021 presenting for regular contractions every 5 mins since 0600. She reports +FMs, No LOF, no VB, no blurry vision, headaches or peripheral edema, and RUQ pain.  She plans on breast and bottle feeding. She request Depo for birth control. She received her prenatal care at [facility]   Dating: By [place] on 08/24/2021 at [redacted]w[redacted]d --->  Estimated Date of Delivery: 03/02/22  Sono:    @[redacted]w[redacted]d , CWD, normal anatomy, breech presentation, 342 g, 51% EFW  @[redacted]w[redacted]d , CWD, normal anatomy, cephalic presentation, 6734 g, 44% EFW  Prenatal History/Complications: Hx of prior macrosomia. Hx of shoulder dystocia  Past Medical History: Past Medical History:  Diagnosis Date   Anemia     Past Surgical History: History reviewed. No pertinent surgical history.  Obstetrical History: OB History     Gravida  4   Para  3   Term  2   Preterm  0   AB  0   Living  3      SAB  0   IAB  0   Ectopic  0   Multiple  0   Live Births  3           Social History Social History   Socioeconomic History   Marital status: Married    Spouse name: Not on file   Number of children: Not on file   Years of education: Not on file   Highest education level: Not on file  Occupational History   Not on file  Tobacco Use   Smoking status: Never    Passive exposure: Never   Smokeless tobacco: Never  Vaping Use   Vaping Use: Never used  Substance and Sexual Activity   Alcohol use: Not Currently    Alcohol/week: 2.0 standard drinks of alcohol    Types: 2 Cans of beer per week    Comment: occassional   Drug use: No   Sexual activity: Yes    Birth control/protection: None  Other Topics Concern   Not on file  Social History Narrative   Not on file   Social Determinants of Health   Financial Resource Strain: Low Risk  (12/01/2021)   Overall Financial Resource Strain  (CARDIA)    Difficulty of Paying Living Expenses: Not hard at all  Food Insecurity: No Food Insecurity (12/01/2021)   Hunger Vital Sign    Worried About Running Out of Food in the Last Year: Never true    Ran Out of Food in the Last Year: Never true  Transportation Needs: No Transportation Needs (12/01/2021)   PRAPARE - Hydrologist (Medical): No    Lack of Transportation (Non-Medical): No  Physical Activity: Inactive (12/01/2021)   Exercise Vital Sign    Days of Exercise per Week: 0 days    Minutes of Exercise per Session: 0 min  Stress: No Stress Concern Present (12/01/2021)   [place]    Feeling of Stress : Only a little  Social Connections: Moderately Integrated (12/01/2021)   Social Connection and Isolation Panel [NHANES]    Frequency of Communication with Friends and Family: Once a week    Frequency of Social Gatherings with Friends and Family: More than three times a week    Attends Religious Services: More than 4 times per year    Active Member of  Clubs or Organizations: No    Attends Archivist Meetings: Never    Marital Status: Married    Family History: History reviewed. No pertinent family history.  Allergies: No Known Allergies  Medications Prior to Admission  Medication Sig Dispense Refill Last Dose   ferrous sulfate 300 (60 Fe) MG/5ML syrup Take 5 mLs (300 mg total) by mouth daily. 150 mL 3 02/27/2022   Prenatal Vit-Fe Fumarate-FA (MULTIVITAMIN-PRENATAL) 27-0.8 MG TABS tablet Take 1 tablet by mouth daily at 12 noon.   02/27/2022   Blood Pressure Monitor MISC For regular home bp monitoring during pregnancy 1 each 0      Review of Systems   All systems reviewed and negative except as stated in HPI  Blood pressure 115/61, pulse 72, temperature 98.3 F (36.8 C), temperature source Oral, unknown if currently breastfeeding. General appearance: alert and  cooperative Lungs: clear to auscultation bilaterally Heart: regular rate and rhythm Abdomen: soft, non-tender; bowel sounds normal  Extremities: Homans sign is negative, no sign of DVT  Presentation: cephalic Fetal monitoringBaseline: 125 bpm, Variability: Good {> 6 bpm), Accelerations: Reactive, and Decelerations: Absent Uterine activityFrequency: Every 5 minutes Dilation: 5.5 Effacement (%): 70 Station: -2 Exam by:: Seleta Rhymes RN   Prenatal labs: ABO, Rh: O/Positive/-- (08/10 1507) Antibody: Negative (10/26 0827) Rubella: 1.72 (08/10 1507) RPR: Non Reactive (10/26 0827)  HBsAg: Negative (08/10 1507)  HIV: Non Reactive (10/26 0827)  GBS: Negative/-- (01/02 1330)  1 hr Glucola: 141 Genetic screening  Declined Anatomy US: Normal Anatomy  Prenatal Transfer Tool  Maternal Diabetes: No Genetic Screening: Declined Maternal Ultrasounds/Referrals: Normal Fetal Ultrasounds or other Referrals:  None Maternal Substance Abuse:  No Significant Maternal Medications:  None Significant Maternal Lab Results:  None Number of Prenatal Visits:greater than 3 verified prenatal visits Other Comments:  None  No results found for this or any previous visit (from the past 24 hour(s)).  Patient Active Problem List   Diagnosis Date Noted   History of shoulder dystocia in prior pregnancy 02/20/2022   Anemia 09/16/2021   History of macrosomia in infant in prior pregnancy, currently pregnant 09/15/2021   Encounter for supervision of normal pregnancy, antepartum 08/31/2021    Assessment/Plan:  Indra Wolters is a 32 y.o. G4P2003 at [redacted]w[redacted]d here for regular contraction occurring every 5 minutes since 0600.  #Labor: Expectant management. Consider AROM #Pain: PRN per patient request #FWB: Cat 1 #ID:  [id number] #MOF: Both #MOC: Depo #Circ:  N/A  Garey Ham, Student-PA  02/27/2022, 3:39 PM  I have seen and examined this patient and agree the above assessment.  Respiratory effort  normal, lochia appropriate, legs negative,  pain level normal.  Christin Fudge 02/27/2022 11:11 PM

## 2022-02-27 NOTE — Progress Notes (Deleted)
OB/GYN Faculty Practice Delivery Note  Aimee Sherman is a 32 y.o. [phone] s/p SVD at [redacted]w[redacted]d. She was admitted for SOL.   ROM: 2h 49m  GBS Status:  Negative/-- (01/02 1330) Maximum Maternal Temperature:  Temp (24hrs), Avg:98.7 F (37.1 C), Min:98.3 F (36.8 C), Max:99 F (37.2 C)    Labor Progress: Patient arrived at 4.5 cm dilation   Delivery Date/Time: 02/27/2022 at 2123 Delivery: Called to room and patient was complete and pushing. Head delivered in [place] position. No nuchal cord present. Shoulder and body delivered in usual fashion. Infant with spontaneous cry, placed on mother's abdomen, dried and stimulated. Cord clamped x 2 after 1-minute delay, and cut by FOB. Cord blood drawn. Placenta delivered spontaneously with gentle cord traction. Fundus firm with massage and Pitocin. Labia, perineum, vagina, and cervix inspected.   Placenta: delivered spontaneously with gentle traction  Complications: none Lacerations: 1st degree perineal laceration  EBL: 507 Analgesia: Epidural   Infant: APGAR (1 MIN): 8   APGAR (5 MINS): 8   APGAR (10 MINS):    Weight: pending  Abram Sander, MD  PGY-1,  Family Medicine  02/27/2022 10:00 PM

## 2022-02-27 NOTE — Discharge Summary (Signed)
Postpartum Discharge Summary  Date of Service updated***     Patient Name: Aimee Sherman DOB: 11-17-1990 MRN: [id number]  Date of admission: 02/27/2022 Delivery date:02/27/2022  Delivering provider: Christin Fudge  Date of discharge: 02/27/2022  Admitting diagnosis: Indication for care in labor or delivery [O75.9] Intrauterine pregnancy: [redacted]w[redacted]d     Secondary diagnosis:  Principal Problem:   Indication for care in labor or delivery  Additional problems: anemia of pregnancy    Discharge diagnosis: Term Pregnancy Delivered and Anemia                                              Post partum procedures: none Augmentation: AROM Complications: None  Hospital course: Onset of Labor With Vaginal Delivery      32 y.o. yo G4P2003 at [redacted]w[redacted]d was admitted in Active Labor on 02/27/2022. Labor course was complicated by anemia of pregnancy  Membrane Rupture Time/Date: 6:52 PM ,02/27/2022   Delivery Method:Vaginal, Spontaneous  Episiotomy: None  Lacerations:  1st degree;Perineal  Patient had a postpartum course complicated by nothing.  She is ambulating, tolerating a regular diet, passing flatus, and urinating well. Patient is discharged home in stable condition on 02/27/22.  Newborn Data: Birth date:02/27/2022  Birth time:9:23 PM  Gender:Female  Living status:Living  Apgars:8 ,8  Weight:   Magnesium Sulfate received: No BMZ received: No Rhophylac:N/A MMR:N/A T-DaP:Given prenatally Flu: Yes Transfusion:No  Physical exam  Vitals:   02/27/22 2100 02/27/22 2128 02/27/22 2149 02/27/22 2201  BP: 104/71 123/66 102/70 120/70  Pulse: 94 79 (!) 101 87  Resp: 12     Temp:      TempSrc:      SpO2:       General: alert, cooperative, and no distress Lochia: appropriate Uterine Fundus: firm Incision: N/A DVT Evaluation: No evidence of DVT seen on physical exam. Negative Homan's sign. No cords or calf tenderness. No significant calf/ankle edema. Labs: Lab Results   Component Value Date   WBC 13.4 (H) 02/27/2022   HGB 10.4 (L) 02/27/2022   HCT 33.3 (L) 02/27/2022   MCV 83.7 02/27/2022   PLT 300 02/27/2022      Latest Ref Rng & Units 08/12/2020    8:18 AM  CMP  Glucose 70 - 99 mg/dL 106   BUN 6 - 20 mg/dL 12   Creatinine 0.44 - 1.00 mg/dL 0.49   Sodium 135 - 145 mmol/L 137   Potassium 3.5 - 5.1 mmol/L 4.0   Chloride 98 - 111 mmol/L 107   CO2 22 - 32 mmol/L 24   Calcium 8.9 - 10.3 mg/dL 8.7   Total Protein 6.5 - 8.1 g/dL 6.8   Total Bilirubin 0.3 - 1.2 mg/dL 0.6   Alkaline Phos 38 - 126 U/L 62   AST 15 - 41 U/L 19   ALT 0 - 44 U/L 15    Edinburgh Score:     No data to display           After visit meds:  Allergies as of 02/27/2022   No Known Allergies   Med Rec must be completed prior to using this [facility]***        Discharge home in stable condition Infant Feeding: Bottle and Breast Infant Disposition:home with mother Discharge instruction: per After Visit Summary and Postpartum booklet. Activity: Advance as tolerated. Pelvic rest for 6 weeks.  Diet: routine diet Future Appointments: Future Appointments  Date Time Provider [place]  04/03/2022  9:50 AM Roma Schanz, CNM [facility]   Follow up Visit:   Please schedule this patient for a In person postpartum visit in 4 weeks with the following provider: Any provider. Additional Postpartum F/U:  Low risk pregnancy complicated by:  Delivery mode:  Vaginal, Spontaneous  Anticipated Birth Control:  PP Depo given   02/27/2022 Christin Fudge, CNM

## 2022-02-27 NOTE — MAU Note (Signed)
Aimee Sherman is a 32 y.o. at [redacted]w[redacted]d here in MAU reporting: regular cts q25min since 0600. Denies LOF or bleeding. Reports positive FM  SVE in office today 4.5cm   Pain score:  Vitals:   02/27/22 1239  BP: 115/61  Pulse: 72  Temp: 98.3 F (36.8 C)     FHT:145 bpm

## 2022-02-27 NOTE — Progress Notes (Signed)
   LOW-RISK PREGNANCY VISIT Patient name: Aimee Sherman MRN [id number]  Date of birth: [birth year] Chief Complaint:   Routine Prenatal Visit (Contractions 10 minutes a part)  History of Present Illness:   Aimee Sherman is a 32 y.o. G28P2003 female at [redacted]w[redacted]d with an Estimated Date of Delivery: 03/02/22 being seen today for ongoing management of a low-risk pregnancy.  Today she reports started contracting around 0300, now about 10 minutes apart. Contractions: Regular.  .  Movement: Present. denies leaking of fluid. Review of Systems:   Pertinent items are noted in HPI Denies abnormal vaginal discharge w/ itching/odor/irritation, headaches, visual changes, shortness of breath, chest pain, abdominal pain, severe nausea/vomiting, or problems with urination or bowel movements unless otherwise stated above. Pertinent History Reviewed:  Reviewed past medical,surgical, social, obstetrical and family history.  Reviewed problem list, medications and allergies. Physical Assessment:   Vitals:   02/27/22 0907  BP: 121/77  Pulse: 73  Weight: 159 lb (72.1 kg)  Body mass index is 34.78 kg/m.        Physical Examination:   General appearance: Well appearing, and in no distress  Mental status: Alert, oriented to person, place, and time  Skin: Warm & dry  Cardiovascular: Normal heart rate noted  Respiratory: Normal respiratory effort, no distress  Abdomen: Soft, gravid, nontender  Pelvic: Cervical exam performed  Dilation: 4.5 Effacement (%): 50 Station: -2  Extremities: Edema: None  Fetal Status: Fetal Heart Rate (bpm): 142 Fundal Height: 38 cm Movement: Present Presentation: Vertex  Chaperone:   interpreter     No results found for this or any previous visit (from the past 24 hour(s)).  Assessment & Plan:  1) Low-risk pregnancy G4P2003 at [redacted]w[redacted]d with an Estimated Date of Delivery: 03/02/22   2) Probable early labor, membranes stripped, labor precautions discussed.  IOL  scheduled for 1/27 (orders in) if undelivered   Meds: No orders of the defined types were placed in this encounter.  Labs/procedures today: none  Plan:  Continue routine obstetrical care  Next visit: prefers in person    Reviewed: Term labor symptoms and general obstetric precautions including but not limited to vaginal bleeding, contractions, leaking of fluid and fetal movement were reviewed in detail with the patient.  All questions were answered.    Follow-up: Return in about 5 weeks (around 04/03/2022) for postpartum checkup.  Future Appointments  Date Time Provider [place]  04/03/2022  9:50 AM Roma Schanz, CNM [facility]    No orders of the defined types were placed in this encounter.  Christin Fudge DNP, CNM 02/27/2022 9:32 AM

## 2022-02-27 NOTE — Anesthesia Preprocedure Evaluation (Signed)
Anesthesia Evaluation  Patient identified by MRN, date of birth, ID band Patient awake    Reviewed: Allergy & Precautions, H&P , NPO status , Patient's Chart, lab work & pertinent test results, reviewed documented beta blocker date and time   Airway Mallampati: I  TM Distance: >3 FB Neck ROM: full    Dental no notable dental hx. (+) Teeth Intact, Dental Advisory Given   Pulmonary neg pulmonary ROS   Pulmonary exam normal breath sounds clear to auscultation       Cardiovascular negative cardio ROS Normal cardiovascular exam Rhythm:regular Rate:Normal     Neuro/Psych negative neurological ROS  negative psych ROS   GI/Hepatic negative GI ROS, Neg liver ROS,,,  Endo/Other  negative endocrine ROS    Renal/GU negative Renal ROS  negative genitourinary   Musculoskeletal   Abdominal   Peds  Hematology  (+) Blood dyscrasia, anemia   Anesthesia Other Findings   Reproductive/Obstetrics (+) Pregnancy                             Anesthesia Physical Anesthesia Plan  ASA: 2  Anesthesia Plan: Epidural   Post-op Pain Management: Minimal or no pain anticipated   Induction:   PONV Risk Score and Plan:   Airway Management Planned: Simple Face Mask and Natural Airway  Additional Equipment: None  Intra-op Plan:   Post-operative Plan: Extubation in OR  Informed Consent: I have reviewed the patients History and Physical, chart, labs and discussed the procedure including the risks, benefits and alternatives for the proposed anesthesia with the patient or authorized representative who has indicated his/her understanding and acceptance.       Plan Discussed with: Anesthesiologist and CRNA  Anesthesia Plan Comments:        Anesthesia Quick Evaluation

## 2022-02-27 NOTE — Progress Notes (Signed)
Patient Vitals for the past 4 hrs:  BP Temp Temp src Pulse Resp  02/27/22 1900 126/72 -- -- 80 14  02/27/22 1831 114/72 -- -- 90 --  02/27/22 1802 -- 98.7 F (37.1 C) Oral -- --  02/27/22 1801 117/73 -- -- 88 --  02/27/22 1730 125/77 -- -- 82 --  02/27/22 1727 113/69 -- -- 82 --  02/27/22 1721 135/88 -- -- 85 --  02/27/22 1717 126/68 -- -- 81 --  02/27/22 1714 109/65 -- -- 89 --  02/27/22 1710 (!) 144/67 -- -- 84 --  02/27/22 1609 120/64 -- -- 98 --   Comfortable w/epidural.  FHR Cat 1.  Cx 8.5/100/-1. AROM w/clear fluid. Anticipate SVD soon.

## 2022-02-28 LAB — RPR: RPR Ser Ql: NONREACTIVE

## 2022-02-28 MED ORDER — SIMETHICONE 80 MG PO CHEW: 80.0000 mg | CHEWABLE_TABLET | ORAL | Status: DC | PRN

## 2022-02-28 MED ORDER — BISACODYL 10 MG RE SUPP: 10.0000 mg | Freq: Every day | RECTAL | Status: DC | PRN

## 2022-02-28 MED ORDER — IBUPROFEN 600 MG PO TABS
600.0000 mg | ORAL_TABLET | Freq: Four times a day (QID) | ORAL | Status: DC
  Administered 2022-02-28 – 2022-03-01 (×7): 600 mg via ORAL
  Filled 2022-02-28 (×7): qty 1

## 2022-02-28 MED ORDER — SENNOSIDES-DOCUSATE SODIUM 8.6-50 MG PO TABS
2.0000 | ORAL_TABLET | ORAL | Status: DC
  Administered 2022-02-28 – 2022-03-01 (×3): 2 via ORAL
  Filled 2022-02-28 (×3): qty 2

## 2022-02-28 MED ORDER — DIPHENHYDRAMINE HCL 25 MG PO CAPS: 25.0000 mg | ORAL_CAPSULE | Freq: Four times a day (QID) | ORAL | Status: DC | PRN

## 2022-02-28 MED ORDER — TETANUS-DIPHTH-ACELL PERTUSSIS 5-2.5-18.5 LF-MCG/0.5 IM SUSY: 0.5000 mL | PREFILLED_SYRINGE | Freq: Once | INTRAMUSCULAR | Status: DC

## 2022-02-28 MED ORDER — METHYLERGONOVINE MALEATE 0.2 MG/ML IJ SOLN: 0.2000 mg | INTRAMUSCULAR | Status: DC | PRN

## 2022-02-28 MED ORDER — COCONUT OIL OIL: 1.0000 | TOPICAL_OIL | Status: DC | PRN

## 2022-02-28 MED ORDER — METHYLERGONOVINE MALEATE 0.2 MG PO TABS: 0.2000 mg | ORAL_TABLET | ORAL | Status: DC | PRN

## 2022-02-28 MED ORDER — BENZOCAINE-MENTHOL 20-0.5 % EX AERO
1.0000 | INHALATION_SPRAY | CUTANEOUS | Status: DC | PRN
  Administered 2022-02-28: 1 via TOPICAL
  Filled 2022-02-28: qty 56

## 2022-02-28 MED ORDER — MEDROXYPROGESTERONE ACETATE 150 MG/ML IM SUSP: 150.0000 mg | INTRAMUSCULAR | Status: DC | PRN

## 2022-02-28 MED ORDER — ACETAMINOPHEN 325 MG PO TABS
650.0000 mg | ORAL_TABLET | ORAL | Status: DC | PRN
  Administered 2022-02-28: 650 mg via ORAL
  Filled 2022-02-28: qty 2

## 2022-02-28 MED ORDER — DIBUCAINE (PERIANAL) 1 % EX OINT: 1.0000 | TOPICAL_OINTMENT | CUTANEOUS | Status: DC | PRN

## 2022-02-28 MED ORDER — ONDANSETRON HCL 4 MG PO TABS: 4.0000 mg | ORAL_TABLET | ORAL | Status: DC | PRN

## 2022-02-28 MED ORDER — FERROUS SULFATE 325 (65 FE) MG PO TABS
325.0000 mg | ORAL_TABLET | ORAL | Status: DC
  Administered 2022-02-28: 325 mg via ORAL
  Filled 2022-02-28: qty 1

## 2022-02-28 MED ORDER — WITCH HAZEL-GLYCERIN EX PADS: 1.0000 | MEDICATED_PAD | CUTANEOUS | Status: DC | PRN

## 2022-02-28 MED ORDER — FLEET ENEMA 7-19 GM/118ML RE ENEM: 1.0000 | ENEMA | Freq: Every day | RECTAL | Status: DC | PRN

## 2022-02-28 MED ORDER — PRENATAL MULTIVITAMIN CH
1.0000 | ORAL_TABLET | Freq: Every day | ORAL | Status: DC
  Administered 2022-02-28 – 2022-03-01 (×2): 1 via ORAL
  Filled 2022-02-28 (×2): qty 1

## 2022-02-28 MED ORDER — MEASLES, MUMPS & RUBELLA VAC IJ SOLR: 0.5000 mL | Freq: Once | INTRAMUSCULAR | Status: DC

## 2022-02-28 MED ORDER — ONDANSETRON HCL 4 MG/2ML IJ SOLN: 4.0000 mg | INTRAMUSCULAR | Status: DC | PRN

## 2022-02-28 NOTE — Discharge Instructions (Signed)
NO SEX UNTIL AFTER YOU GET YOUR BIRTH CONTROL  

## 2022-02-28 NOTE — Anesthesia Postprocedure Evaluation (Signed)
Anesthesia Post Note  Patient: Aimee Sherman  Procedure(s) Performed: AN AD [place]     Patient location during evaluation: Mother Baby Anesthesia Type: Epidural Level of consciousness: awake and alert Pain management: pain level controlled Vital Signs Assessment: post-procedure vital signs reviewed and stable Respiratory status: spontaneous breathing, nonlabored ventilation and respiratory function stable Cardiovascular status: stable Postop Assessment: no headache, no backache and epidural receding Anesthetic complications: no   No notable events documented.  Last Vitals:  Vitals:   02/28/22 0040 02/28/22 0445  BP: 101/60 (!) 107/58  Pulse: 97 79  Resp: 18 18  Temp: 36.9 C (!) 36.3 C  SpO2: 99% 99%    Last Pain:  Vitals:   02/28/22 0715  TempSrc:   PainSc: 0-No pain   Pain Goal:                   [name]

## 2022-02-28 NOTE — Progress Notes (Signed)
POSTPARTUM PROGRESS NOTE  Post Partum Day 1  Subjective:  Aimee Sherman is a 32 y.o. [id number] s/p VD at [redacted]w[redacted]d.  She reports she is doing well. No acute events overnight. She denies any problems with ambulating, voiding or po intake. Denies nausea or vomiting.  Pain is well controlled.  Lochia is adequate .  Objective: Blood pressure (!) 107/58, pulse 79, temperature (!) 97.4 F (36.3 C), temperature source Oral, resp. rate 18, SpO2 99 %, unknown if currently breastfeeding.  Physical Exam:  General: alert, cooperative and no distress Chest: no respiratory distress Heart:regular rate, distal pulses intact Uterine Fundus: firm, appropriately tender DVT Evaluation: No calf swelling or tenderness Extremities: no edema Skin: warm, dry  Recent Labs    02/27/22 1546  HGB 10.4*  HCT 33.3*    Assessment/Plan: Aimee Sherman is a 32 y.o. [phone] s/p SVD at [redacted]w[redacted]d   PPD#1 - Doing well  Routine postpartum care Anemia: asymptomatic- start Ferrous sulfate Contraception: Depo Feeding: Breast Dispo: Plan for discharge tomorrow.   LOS: 1 day   Shelda Pal, DO OB Fellow  02/28/2022, 7:42 AM

## 2022-02-28 NOTE — Progress Notes (Signed)
Ordered pt meals and check on her needs, by [place].

## 2022-03-01 MED ORDER — IBUPROFEN 600 MG PO TABS: 600.0000 mg | ORAL_TABLET | Freq: Four times a day (QID) | ORAL | 0 refills | Status: AC | PRN

## 2022-03-01 NOTE — Lactation Note (Signed)
This note was copied from a baby's chart. Lactation Consultation Note  Patient Name: Aimee Sherman [id number] Date: 03/01/2022 Reason for consult: Initial assessment;Term Age:33 hours  Consult was done in Spanish:  Initial visit to 97 hours old infant. Birthing parent bottlefed 28-mL of formula prior to [facility] visit. Birthing parent states infant has difficult latching and gets really fuzzy when attempting to breastfeed. LC noted left short shafted nipple and right inverted nipple. Left nipple has a compression stripe. Provided hand pump for colostrum expression and nipple eversion. Reviewed how to get a deep latch and demonstrated hand expression. Birthing parent is moderately engorged, provided ice and advised to use for 15-minutes. Reviewed engorgement management. Discussed normal newborn behavior and patterns, signs of good milk transfer, hunger cues, tummy size and benefits of skin to skin.   Plan: 1-Feeding on demand or 8-12 times in 24h period. 2-Use manual pump as needed 3-Manage engorgement 4-Encouraged birthing parent rest, hydration and food intake.   Contact LC as needed for feeds/support/concerns/questions. All questions answered at this time. Provided Lactation services brochure and promoted local resources for support.  Maternal Data Has patient been taught Hand Expression?: Yes Does the patient have breastfeeding experience prior to this delivery?: Yes How long did the patient breastfeed?: 6 months, first child; pumped for a couple of months due to NICU admission, second child; and, 3 months, third child  Feeding Mother's Current Feeding Choice: Breast Milk and Formula  Lactation Tools Discussed/Used Tools: Pump;Flanges;Coconut oil Flange Size: 21 Breast pump type: Manual Pump Education: Setup, frequency, and cleaning;Milk Storage Reason for Pumping: nipple eversion, stimulation and supplementation Pumping frequency: as needed Pumped volume:  (drops with  demonstration)  Interventions Interventions: Breast feeding basics reviewed;Skin to skin;Hand express;Breast massage;Expressed milk;Hand pump;Education;Ice;Pace feeding;LC Services brochure  Discharge Discharge Education: Engorgement and breast care;Warning signs for feeding baby Pump: Manual WIC Program: Yes  Consult Status Consult Status: Complete    Jihaad Bruschi A Higuera Ancidey 03/01/2022, 8:53 AM

## 2022-03-04 ENCOUNTER — Inpatient Hospital Stay (HOSPITAL_COMMUNITY)
Admission: RE | Admit: 2022-03-04 | Payer: BC Managed Care – PPO | Source: Home / Self Care | Admitting: Obstetrics & Gynecology

## 2022-03-04 ENCOUNTER — Inpatient Hospital Stay (HOSPITAL_COMMUNITY): Payer: BC Managed Care – PPO

## 2022-03-08 ENCOUNTER — Telehealth (HOSPITAL_COMMUNITY): Payer: Self-pay | Admitting: *Deleted

## 2022-03-08 NOTE — Telephone Encounter (Signed)
Attempted Hospital Discharge Follow-Up Call with help of telephonic interpreter "Melissa [phone]".  Patient's voice mail box is full.  Unable to leave a message.

## 2022-04-03 ENCOUNTER — Encounter: Payer: Self-pay | Admitting: Women's Health

## 2022-04-03 ENCOUNTER — Ambulatory Visit (INDEPENDENT_AMBULATORY_CARE_PROVIDER_SITE_OTHER): Payer: BC Managed Care – PPO | Admitting: Women's Health

## 2022-04-03 DIAGNOSIS — Z30013 Encounter for initial prescription of injectable contraceptive: Secondary | ICD-10-CM

## 2022-04-03 DIAGNOSIS — Z3202 Encounter for pregnancy test, result negative: Secondary | ICD-10-CM | POA: Diagnosis not present

## 2022-04-03 LAB — POCT URINE PREGNANCY: Preg Test, Ur: NEGATIVE

## 2022-04-03 MED ORDER — MEDROXYPROGESTERONE ACETATE 150 MG/ML IM SUSP
150.0000 mg | Freq: Once | INTRAMUSCULAR | Status: AC
  Administered 2022-04-03: 150 mg via INTRAMUSCULAR

## 2022-04-03 NOTE — Progress Notes (Signed)
POSTPARTUM VISIT Patient name: Aimee Sherman MRN [id number]  Date of birth: 02/27/1990 Chief Complaint:   Postpartum Care (Wants depo)  History of Present Illness:   Aimee Sherman is a 32 y.o. [phone] Hispanic female being seen today for a postpartum visit. She is 5 weeks postpartum following a spontaneous vaginal delivery at 39.4 gestational weeks. IOL: no, for n/a. Anesthesia: epidural.  Laceration: 1st degree.  Complications: EBL 0000000. Inpatient contraception: no.   Pregnancy uncomplicated. Tobacco use: no. Substance use disorder: no. Last pap smear: 11/11/20 and results were  neg . Next pap smear due: 2025 No LMP recorded.  Postpartum course has been uncomplicated. Bleeding none. Bowel function is normal. Bladder function is normal. Urinary incontinence? no, fecal incontinence? no Patient is not sexually active. Last sexual activity: prior to birth of baby. Desired contraception: Depo, wants 1st dose here then will f/u at [facility]. Patient does not want a pregnancy in the future.  Desired family size is 4 children.   [facility]stream - 04/03/22 [id number]       Pregnancy Intention Screening   Does the patient want to become pregnant in the next year? No    Does the patient's partner want to become pregnant in the next year? No    Would the patient like to discuss contraceptive options today? No      Contraception Wrap Up   Current Method Abstinence    End Method Abstinence;Hormonal Injection    Contraception Counseling Provided No            The pregnancy intention screening data noted above was reviewed. Potential methods of contraception were discussed. The patient elected to proceed with Abstinence; Hormonal Injection.  Edinburgh Postpartum Depression Screening: negative  Edinburgh Postnatal Depression Scale - 04/03/22 0936       Edinburgh Postnatal Depression Scale:  In the Past 7 Days   I have been able to laugh and see the funny side of things. 0    I have  looked forward with enjoyment to things. 0    I have blamed myself unnecessarily when things went wrong. 0    I have been anxious or worried for no good reason. 0    I have felt scared or panicky for no good reason. 0    Things have been getting on top of me. 1    I have been so unhappy that I have had difficulty sleeping. 0    I have felt sad or miserable. 0    I have been so unhappy that I have been crying. 0    The thought of harming myself has occurred to me. 0    Edinburgh Postnatal Depression Scale Total 1                12/01/2021    9:08 AM 09/15/2021    2:11 PM  GAD 7 : Generalized Anxiety Score  Nervous, Anxious, on Edge 0 0  Control/stop worrying 0 0  Worry too much - different things 0 0  Trouble relaxing 0 0  Restless 0 0  Easily annoyed or irritable 0 0  Afraid - awful might happen 0 0  Total GAD 7 Score 0 0     Baby's course has been uncomplicated. Baby is feeding by bottle. Infant has a pediatrician/family doctor? Yes.  Childcare strategy if returning to work/school: family.  Pt has material needs met for her and baby: Yes.   Review of Systems:   Pertinent items are noted in HPI Denies  Abnormal vaginal discharge w/ itching/odor/irritation, headaches, visual changes, shortness of breath, chest pain, abdominal pain, severe nausea/vomiting, or problems with urination or bowel movements. Pertinent History Reviewed:  Reviewed past medical,surgical, obstetrical and family history.  Reviewed problem list, medications and allergies. OB History  Gravida Para Term Preterm AB Living  '4 4 3 '$ 0 0 4  SAB IAB Ectopic Multiple Live Births  0 0 0 0 4    # Outcome Date GA Lbr Len/2nd Weight Sex Delivery Anes PTL Lv  4 Term 02/27/22 5w4d14:35 / 00:48 7 lb 5.1 oz (3.32 kg) F Vag-Spont EPI  LIV  3 Para 05/07/15  / 00:25 8 lb 1.5 oz (3.67 kg) M Vag-Spont EPI N LIV  2 Term 05/02/11 445w0d10 lb 11 oz (4.848 kg) F Vag-Spont EPI N LIV  1 Term 09/02/07 3863w0d lb 8 oz (3.402  kg) F Vag-Spont None N LIV   Physical Assessment:   Vitals:   04/03/22 0939  BP: 125/79  Pulse: (!) 54  Weight: 139 lb (63 kg)  Height: '4\' 9"'$  (1.448 m)  Body mass index is 30.08 kg/m.       Physical Examination:   General appearance: alert, well appearing, and in no distress  Mental status: alert, oriented to person, place, and time  Skin: warm & dry   Cardiovascular: normal heart rate noted   Respiratory: normal respiratory effort, no distress   Breasts: deferred, no complaints   Abdomen: soft, non-tender   Pelvic: lac healing well. Thin prep pap obtained: No  Rectal: not examined  Extremities: Edema: none   Chaperone:  Spanish interpreter          Results for orders placed or performed in visit on 04/03/22 (from the past 24 hour(s))  POCT urine pregnancy   Collection Time: 04/03/22  9:42 AM  Result Value Ref Range   Preg Test, Ur Negative Negative    Assessment & Plan:  1) Postpartum exam 2) 5 wks s/p spontaneous vaginal delivery 3) bottle feeding 4) Depression screening 5) Contraception management: 1st depo here today, condoms x 2wks. [facility] dep[facility]. To call them today and let them know she got it so they can schedule next dose  Essential components of care per ACOG recommendations:  1.  Mood and well being:  If positive depression screen, discussed and plan developed.  If using tobacco we discussed reduction/cessation and risk of relapse If current substance abuse, we discussed and referral to local resources was offered.   2. Infant care and feeding:  If breastfeeding, discussed returning to work, pumping, breastfeeding-associated pain, guidance regarding return to fertility while lactating if not using another method. If needed, patient was provided with a letter to be allowed to pump q 2-3hrs to support lactation in a private location with access to a refrigerator to store breastmilk.   Recommended that all caregivers be immunized  for flu, pertussis and other preventable communicable diseases If pt does not have material needs met for her/baby, referred to local resources for help obtaining these.  3. Sexuality, contraception and birth spacing Provided guidance regarding sexuality, management of dyspareunia, and resumption of intercourse Discussed avoiding interpregnancy interval <[id number] recommended birth spacing of 18 months  4. Sleep and fatigue Discussed coping options for fatigue and sleep disruption Encouraged family/partner/community support of 4 hrs of uninterrupted sleep to help with mood and fatigue  5. Physical recovery  If pt had a C/S, assessed incisional pain  and providing guidance on normal vs prolonged recovery If pt had a laceration, perineal healing and pain reviewed.  If urinary or fecal incontinence, discussed management and referred to PT or uro/gyn if indicated  Patient is safe to resume physical activity. Discussed attainment of healthy weight.  6.  Chronic disease management Discussed pregnancy complications if any, and their implications for future childbearing and long-term maternal health. Review recommendations for prevention of recurrent pregnancy complications, such as 17 hydroxyprogesterone caproate to reduce risk for recurrent PTB not applicable, or aspirin to reduce risk of preeclampsia not applicable. Pt had GDM: no. If yes, 2hr GTT scheduled: not applicable. Reviewed medications and non-pregnant dosing including consideration of whether pt is breastfeeding using a reliable resource such as LactMed: not applicable Referred for f/u w/ PCP or subspecialist providers as indicated: not applicable  7. Health maintenance Mammogram at 32yo or earlier if indicated Pap smears as indicated  Meds: No orders of the defined types were placed in this encounter.   Follow-up: Return for prn.   Orders Placed This Encounter  Procedures   POCT urine pregnancy    Roma Schanz CNM,  [facility] 04/03/2022 10:07 AM

## 2022-04-03 NOTE — Addendum Note (Signed)
Addended by: Janece Canterbury on: 04/03/2022 10:09 AM   Modules accepted: Orders

## 2022-04-03 NOTE — Patient Instructions (Signed)
Medroxyprogesterone Injection (Contraception) Qu es este medicamento? La MEDROXIPROGESTERONA evita la ovulacin y Water quality scientist. Pertenece a un grupo de medicamentos llamados anticonceptivos. Este medicamento es una hormona del grupo de los progestgenos. Este medicamento puede ser utilizado para otros usos; si tiene alguna pregunta consulte con su proveedor de atencin mdica o con su farmacutico. MARCAS COMUNES: Depo-Provera, Depo-subQ Provera 104 Qu le debo informar a mi profesional de la salud antes de tomar este medicamento? Necesitan saber si usted presenta alguno de los siguientes problemas o situaciones: Asma Cogulos sanguneos Cncer de mama o antecedentes familiares de cncer de mama Depresin Diabetes Trastorno de la alimentacin (anorexia nerviosa) Consume bebidas alcohlicas con frecuencia Ataque cardiaco Presin arterial alta Infeccin por VIH o SIDA Enfermedad renal Enfermedad heptica Migraas Osteoporosis, huesos dbiles Convulsiones Accidente cerebrovascular Uso de tabaco Sangrado vaginal Una reaccin alrgica o inusual a la medroxiprogesterona, a otros medicamentos, alimentos, colorantes o conservantes Si est embarazada o buscando quedar embarazada Si est amamantando a un beb Cmo debo utilizar este medicamento? Depo-Provera CI inyeccin anticonceptiva se administra en un msculo. Depo-subQ Provera 104 inyectable se administra por va subcutnea. Se administra en un hospital o en un entorno clnico. Esta inyeccin generalmente se aplica durante los primeros 5 das del inicio de un periodo menstrual o 6 semanas despus de haber tenido un beb. Recibir un folleto de informacin para el paciente con cada receta y en cada ocasin que la vuelva a surtir. Asegrese de leer esta informacin cada vez cuidadosamente. El folleto puede cambiar frecuentemente. Hable con su equipo de atencin sobre el uso de este medicamento en nios. Puede requerir atencin especial.  Estas inyecciones han sido usadas en nias que han empezado a tener perodos [place]. Sobredosis: Pngase en contacto inmediatamente con un centro toxicolgico o una sala de urgencia si usted cree que haya tomado demasiado medicamento. ATENCIN: [employer] es solo para usted. No comparta este medicamento con nadie. Qu sucede si me olvido de una dosis? Cumpla con las citas para dosis de seguimiento. Debe recibir Ardelia Mems inyeccin una vez cada 3 meses. Es importante no olvidar ninguna dosis. Llame a su equipo de atencin si no puede asistir a una cita. Qu puede interactuar con este medicamento? Antibiticos o medicamentos para inyecciones, especialmente rifampicina y griseofulvina Medicamentos antivirales para VIH o hepatitis Aprepitant Armodafinilo [place] Medicamentos para convulsiones, tales como [place], [place], [place], [place], [place], primidona, topiramato Mit[place]ano Modafinilo Hierba de [place] Puede ser que esta lista no menciona todas las posibles interacciones. Informe a su profesional de [employer]	[place] de [employer] productos a base de hierbas, medicamentos de [place] o suplementos nutritivos que est tomando. Si usted fuma, consume bebidas alcohlicas o si utiliza drogas ilegales, indqueselo tambin a su profesional de [employer]	[place]. Algunas sustancias pueden interactuar con su medicamento. A qu debo estar atento al usar Coca[employer]Cola? Este medicamento no la protege de la infeccin por VIH (SIDA) ni de ninguna otra enfermedad de transmisin sexual. Usar este producto puede causarle prdida de calcio de los huesos. La prdida de calcio puede causar debilidad sea (osteoporosis). Solamente use este producto por ms de 2 aos si no hay otro mtodo anticonceptivo que sea adecuado para usted. Cuanto ms tiempo use este producto como anticonceptivo, ms posibilidades tendr de [employer] de debilitar sus huesos. Consulte a su equipo de atencin  sobre cmo Theatre manager fuertes sus [employer]. Es posible que tenga un cambio en el patrn de sangrado o perodos menstruales irregulares. Muchas mujeres dejan de tener perodos [employer]  toman Coca-Cola. Si recibi sus inyecciones a tiempo, sus probabilidades de estar embarazada son Dessie Coma. Si cree que podra estar embarazada, consulte a su equipo de atencin tan pronto como sea posible. Informe a su equipo de atencin si desea quedar embarazada en el prximo ao. [place] de este medicamento puede durar mucho tiempo despus de recibir la ltima inyeccin. Qu efectos secundarios puedo tener al [employer] este medicamento? Efectos secundarios que debe informar a su equipo de atencin tan pronto como sea posible: Reacciones alrgicas: erupcin cutnea, comezn/picazn, urticaria, hinchazn de la cara, los labios, la lengua o la garganta Cogulo sanguneo: Social research officer, government, hinchazn, calor en una pierna, falta de aire, dolor en el pecho Problemas en la vescula biliar: dolor de estmago intenso, nuseas, vmitos, fiebre Aumento de la presin arterial Lesin en el hgado: dolor en la regin abdominal superior derecha, prdida de apetito, nuseas, heces de color claro, orina amarilla oscura o marrn, color amarillento de los ojos o la piel, debilidad o fatiga inusuales Migraas o dolores de cabeza nuevos o peores Convulsiones Accidente cerebrovascular: entumecimiento o debilidad repentinos de la cara, un brazo o una pierna, dificultad para hablar, confusin, dificultad para caminar, prdida de equilibrio o coordinacin, mareos, dolor de cabeza intenso, cambio en la visin Flujo vaginal inusual, comezn/picazn u olor Empeoramiento del estado de nimo, sentimientos de depresin Efectos secundarios que generalmente no requieren atencin mdica (debe informarlos a su equipo de atencin si persisten o si son molestos): Social research officer, government o sensibilidad de las Architectural technologist oscuras de la piel en la cara u otras  reas expuestas al sol Ciclos menstruales irregulares o sangrado ligero entre periodos menstruales Nuseas Aumento de peso Puede ser que esta lista no menciona todos los posibles efectos secundarios. Comunquese a su mdico por asesoramiento mdico [employer]. Usted puede informar los efectos secundarios a la FDA por telfono al 1-800-FDA-1088. Dnde debo guardar mi medicina? Solamente un equipo de atencin puede Oceanographer inyeccin. No se guarda en su casa. ATENCIN: Este folleto es un resumen. Puede ser que no cubra toda la posible informacin. Si usted tiene preguntas acerca de esta medicina, consulte con su mdico, su farmacutico o su profesional de Technical sales engineer.  2023 Elsevier/Gold Standard (2021-12-14 00:00:00)

## 2022-08-23 ENCOUNTER — Other Ambulatory Visit: Payer: Self-pay

## 2022-09-22 IMAGING — US US SOFT TISSUE HEAD/NECK
1 series · 8 of 8 positions shown · non-contrast
Comparison: None.

CLINICAL DATA: Palpable abnormality involving the left lateral neck
for the past 6 weeks.

EXAM:
ULTRASOUND OF HEAD/NECK SOFT TISSUES
TECHNIQUE: Ultrasound examination of the head and neck soft tissues was
performed in the area of clinical concern.

[Series 1: us soft tissue head & neck (non-thyroid) · 8 of 8 slices shown]
[im 1/8]
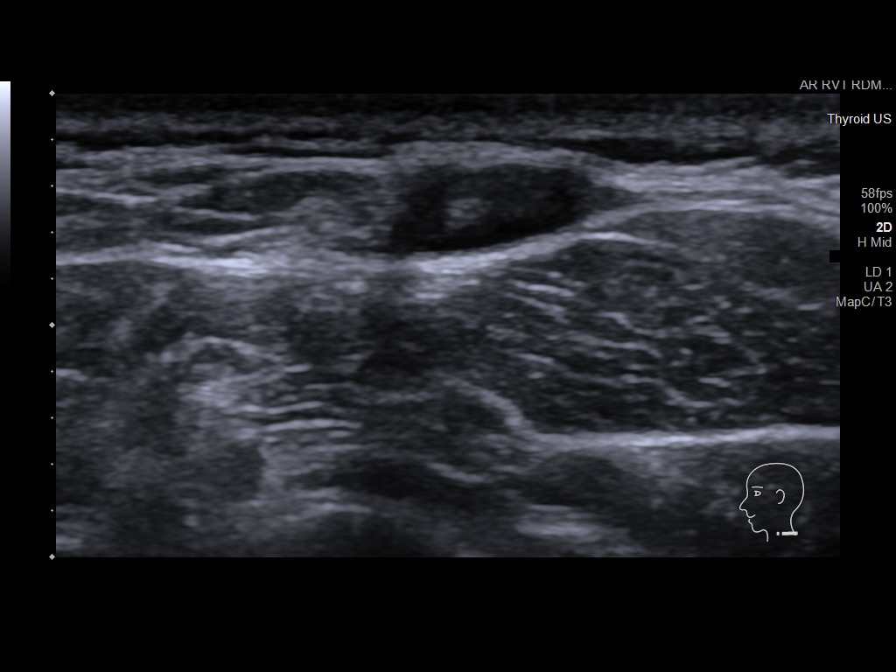
[im 2/8]
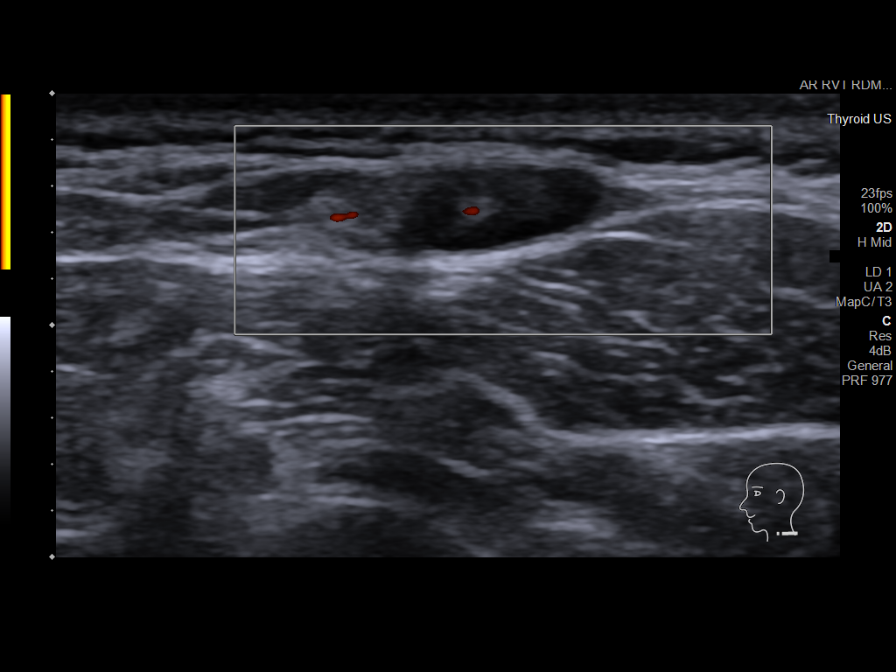
[im 3/8]
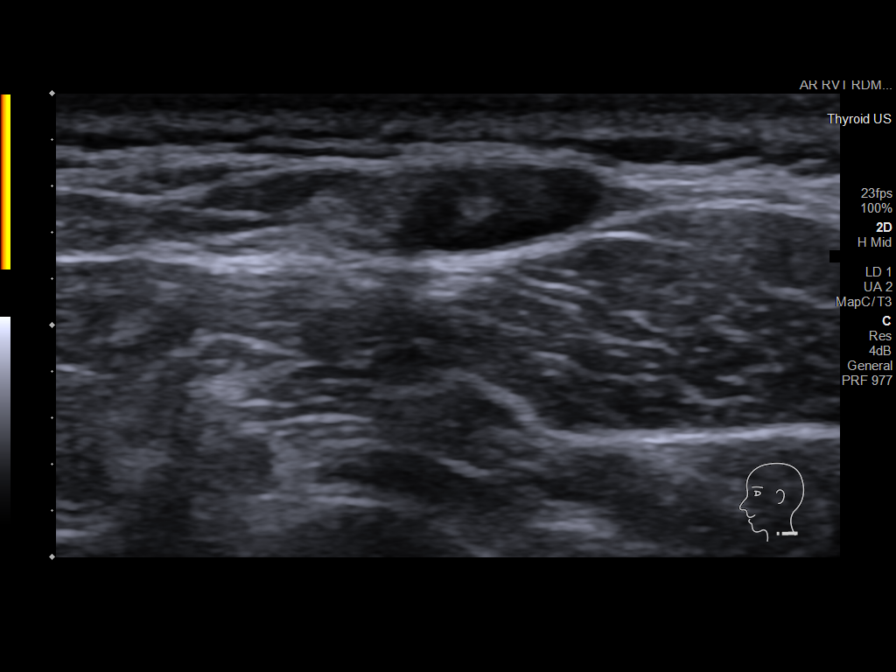
[im 4/8]
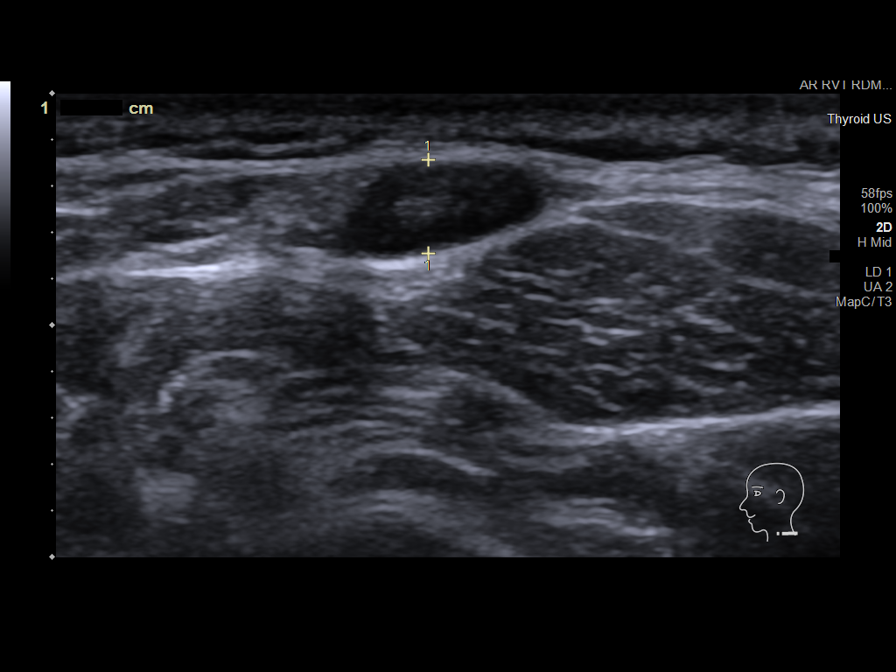
[im 5/8]
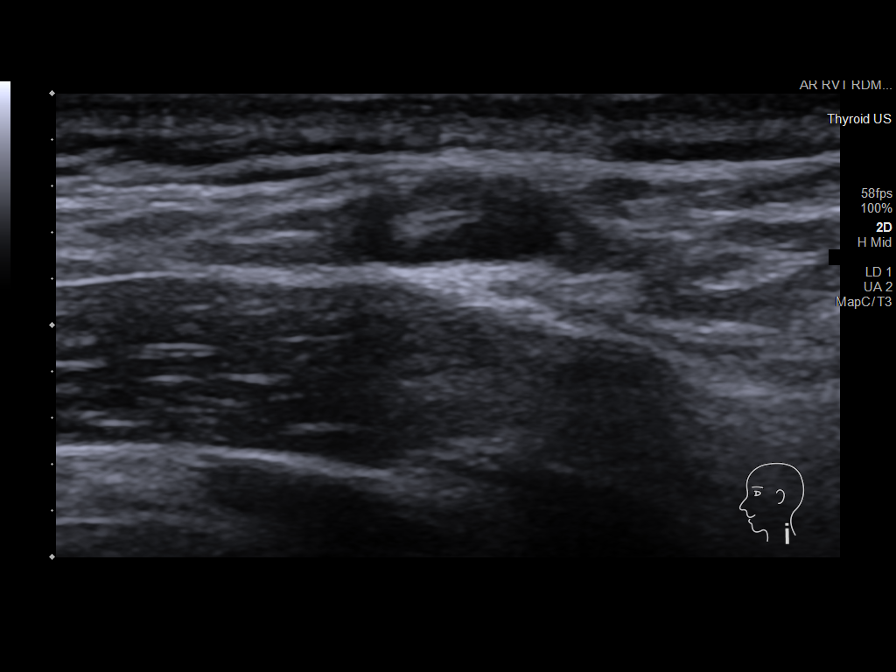
[im 6/8]
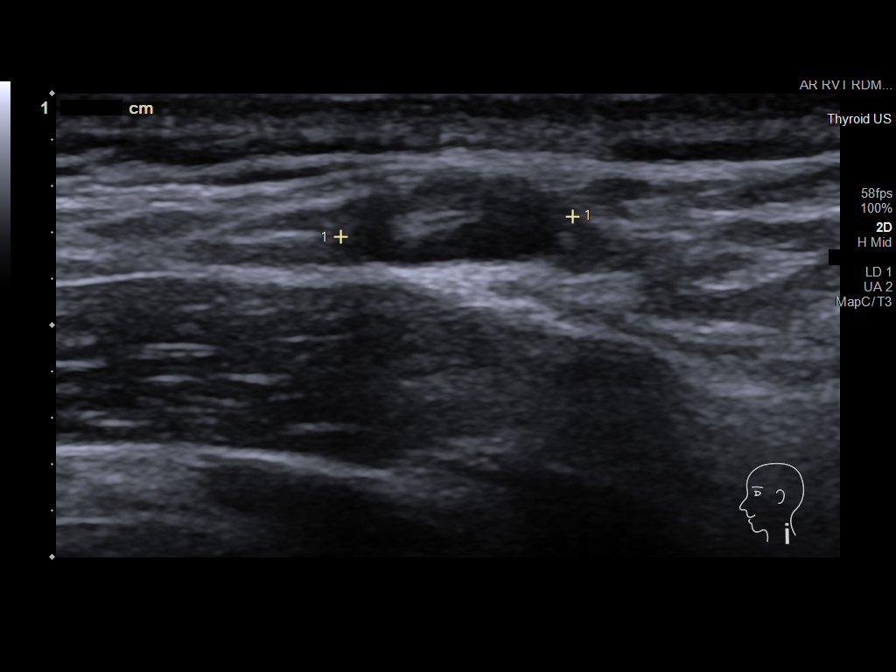
[im 7/8]
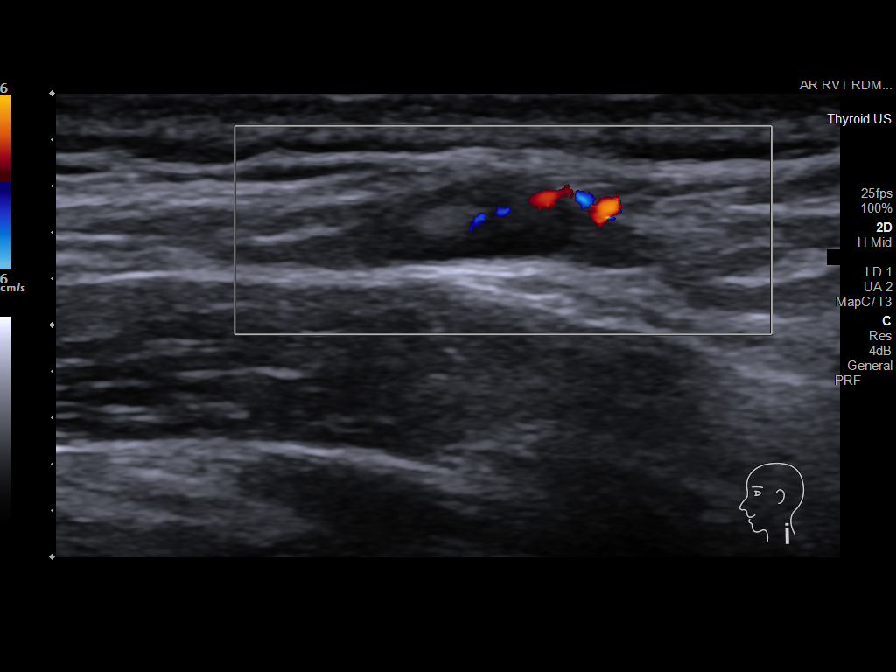
[im 8/8]
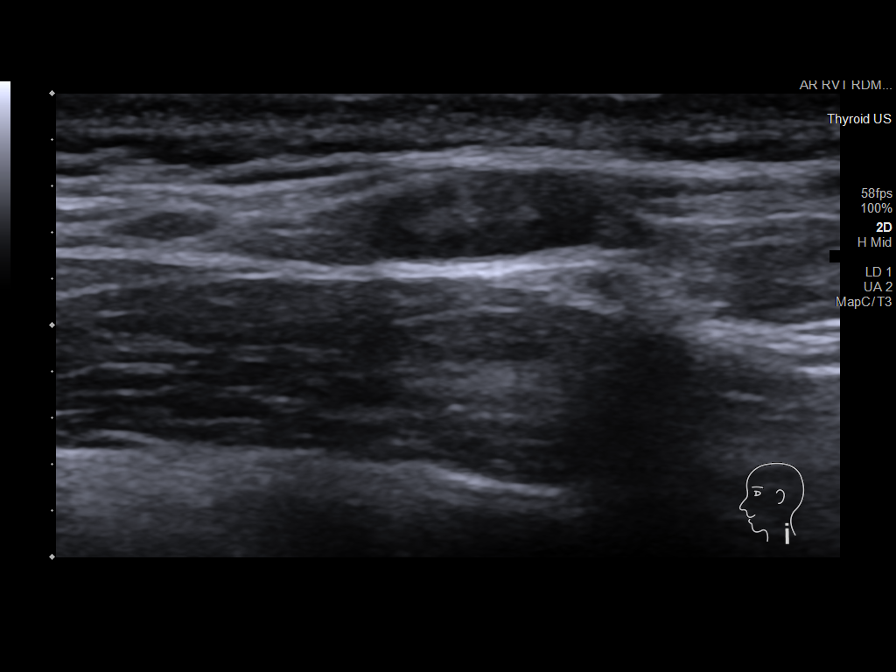

[8 of 8 positions shown; findings below may reference images not displayed]

FINDINGS: Sonographic evaluation of the patient's palpable area of concern
within the left lateral neck correlates with a mildly prominent
though non pathologically enlarged subcutaneous cervical lymph node.
The lymph node is not enlarged by size criteria measuring 0.4 cm in
greatest short axis diameter and maintains a benign fatty hilum
(representative image 5).

Otherwise, there is no sonographic correlate for patient's palpable
area of concern. Specifically, no discrete solid or cystic lesions.
IMPRESSION: Patient's palpable area of concern within the left lateral neck
correlates with a solitary mildly prominent though non
pathologically enlarged and benign-appearing subcutaneous cervical
lymph node, presumably reactive in etiology. Clinical correlation to
resolution is advised.
